# Patient Record
Sex: Female | Born: 1990 | Hispanic: Yes | Marital: Single | Smoking: Former smoker
Health system: Southern US, Academic
[De-identification: ages and names within clinical notes are randomized; demographics above are authoritative.]

## PROBLEM LIST (undated history)

## (undated) DIAGNOSIS — F419 Anxiety disorder, unspecified: Secondary | ICD-10-CM

## (undated) DIAGNOSIS — F32A Depression, unspecified: Secondary | ICD-10-CM

## (undated) DIAGNOSIS — J45909 Unspecified asthma, uncomplicated: Secondary | ICD-10-CM

## (undated) HISTORY — DX: Unspecified asthma, uncomplicated: J45.909

## (undated) HISTORY — DX: Depression, unspecified: F32.A

## (undated) HISTORY — DX: Anxiety disorder, unspecified: F41.9

---
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
## 2011-05-18 ENCOUNTER — Inpatient Hospital Stay: Payer: Self-pay | Admitting: Psychiatry

## 2011-05-18 LAB — URINALYSIS, COMPLETE
Glucose,UR: NEGATIVE mg/dL (ref 0–75)
Nitrite: NEGATIVE
Ph: 8 (ref 4.5–8.0)
Protein: NEGATIVE
RBC,UR: <1 /HPF (ref 0–5)
Squamous Epithelial: 1
WBC UR: <1 /HPF (ref 0–5)

## 2011-05-18 LAB — COMPREHENSIVE METABOLIC PANEL
Albumin: 5.3 g/dL — ABNORMAL HIGH (ref 3.4–5.0)
Alkaline Phosphatase: 68 U/L (ref 50–136)
Calcium, Total: 9.1 mg/dL (ref 8.5–10.1)
Chloride: 104 mmol/L (ref 98–107)
Co2: 23 mmol/L (ref 21–32)
Creatinine: 0.74 mg/dL (ref 0.60–1.30)
Glucose: 108 mg/dL — ABNORMAL HIGH (ref 65–99)
Osmolality: 280 (ref 275–301)
SGOT(AST): 31 U/L (ref 15–37)

## 2011-05-18 LAB — DRUG SCREEN, URINE
Amphetamines, Ur Screen: NEGATIVE (ref ?–1000)
Benzodiazepine, Ur Scrn: NEGATIVE (ref ?–200)
Cocaine Metabolite,Ur ~~LOC~~: NEGATIVE (ref ?–300)
MDMA (Ecstasy)Ur Screen: NEGATIVE (ref ?–500)
Tricyclic, Ur Screen: POSITIVE (ref ?–1000)

## 2011-05-18 LAB — CBC
HCT: 41.1 % (ref 35.0–47.0)
HGB: 13.3 g/dL (ref 12.0–16.0)
MCH: 28.1 pg (ref 26.0–34.0)
MCHC: 32.3 g/dL (ref 32.0–36.0)
Platelet: 242 10*3/uL (ref 150–440)
RBC: 4.73 10*6/uL (ref 3.80–5.20)
WBC: 10 10*3/uL (ref 3.6–11.0)

## 2011-05-18 LAB — ETHANOL
Ethanol %: <0.003 % (ref 0.000–0.080)
Ethanol: <3 mg/dL

## 2011-05-18 LAB — TSH: Thyroid Stimulating Horm: 4.37 u[IU]/mL

## 2011-05-21 LAB — LIPID PANEL
Cholesterol: 130 mg/dL (ref 0–200)
HDL Cholesterol: 60 mg/dL (ref 40–60)
Ldl Cholesterol, Calc: 58 mg/dL (ref 0–100)
VLDL Cholesterol, Calc: 12 mg/dL (ref 5–40)

## 2011-05-31 LAB — CBC
HGB: 10.5 g/dL — ABNORMAL LOW (ref 12.0–16.0)
MCV: 86 fL (ref 80–100)
RDW: 15.5 % — ABNORMAL HIGH (ref 11.5–14.5)

## 2011-05-31 LAB — DRUG SCREEN, URINE
Amphetamines, Ur Screen: NEGATIVE (ref ?–1000)
Barbiturates, Ur Screen: NEGATIVE (ref ?–200)
Benzodiazepine, Ur Scrn: NEGATIVE (ref ?–200)
Cannabinoid 50 Ng, Ur ~~LOC~~: POSITIVE (ref ?–50)
MDMA (Ecstasy)Ur Screen: NEGATIVE (ref ?–500)
Methadone, Ur Screen: NEGATIVE (ref ?–300)
Opiate, Ur Screen: NEGATIVE (ref ?–300)

## 2011-05-31 LAB — URINALYSIS, COMPLETE
Bacteria: NONE SEEN
Glucose,UR: NEGATIVE mg/dL (ref 0–75)
Ketone: NEGATIVE
Nitrite: NEGATIVE
Ph: 7 (ref 4.5–8.0)
Protein: NEGATIVE
RBC,UR: 4 /HPF (ref 0–5)
Specific Gravity: 1.006 (ref 1.003–1.030)
Squamous Epithelial: NONE SEEN
WBC UR: 3 /HPF (ref 0–5)

## 2011-05-31 LAB — COMPREHENSIVE METABOLIC PANEL
Alkaline Phosphatase: 60 U/L (ref 50–136)
Anion Gap: 11 (ref 7–16)
Bilirubin,Total: 0.2 mg/dL (ref 0.2–1.0)
Chloride: 104 mmol/L (ref 98–107)
Co2: 27 mmol/L (ref 21–32)
Creatinine: 0.62 mg/dL (ref 0.60–1.30)
EGFR (African American): >60
EGFR (Non-African Amer.): >60
Potassium: 3.8 mmol/L (ref 3.5–5.1)
Total Protein: 7.2 g/dL (ref 6.4–8.2)

## 2011-05-31 LAB — TSH: Thyroid Stimulating Horm: 1.21 u[IU]/mL

## 2011-05-31 LAB — PREGNANCY, URINE: Pregnancy Test, Urine: NEGATIVE m[IU]/mL

## 2011-05-31 LAB — ETHANOL: Ethanol: <3 mg/dL

## 2011-05-31 LAB — SALICYLATE LEVEL: Salicylates, Serum: 3.6 mg/dL — ABNORMAL HIGH

## 2011-06-01 ENCOUNTER — Inpatient Hospital Stay: Payer: Self-pay | Admitting: Psychiatry

## 2011-06-02 LAB — IRON AND TIBC: Iron Bind.Cap.(Total): 390 ug/dL (ref 250–450)

## 2011-06-02 LAB — FOLATE: Folic Acid: 12.3 ng/mL (ref 3.1–100.0)

## 2011-06-02 LAB — LIPID PANEL
Ldl Cholesterol, Calc: 55 mg/dL (ref 0–100)
Triglycerides: 102 mg/dL (ref 0–200)
VLDL Cholesterol, Calc: 20 mg/dL (ref 5–40)

## 2011-06-02 LAB — HEMOGLOBIN: HGB: 11.1 g/dL — ABNORMAL LOW (ref 12.0–16.0)

## 2011-06-07 LAB — COMPREHENSIVE METABOLIC PANEL
Albumin: 4 g/dL (ref 3.4–5.0)
Alkaline Phosphatase: 57 U/L (ref 50–136)
Anion Gap: 9 (ref 7–16)
BUN: 8 mg/dL (ref 7–18)
Calcium, Total: 8.9 mg/dL (ref 8.5–10.1)
Chloride: 102 mmol/L (ref 98–107)
Co2: 27 mmol/L (ref 21–32)
Creatinine: 0.61 mg/dL (ref 0.60–1.30)
EGFR (Non-African Amer.): >60
Glucose: 78 mg/dL (ref 65–99)
Potassium: 4.3 mmol/L (ref 3.5–5.1)
SGOT(AST): 26 U/L (ref 15–37)
Sodium: 138 mmol/L (ref 136–145)

## 2011-06-07 LAB — VALPROIC ACID LEVEL: Valproic Acid: 91 ug/mL

## 2011-08-12 ENCOUNTER — Emergency Department: Payer: Self-pay | Admitting: Internal Medicine

## 2011-08-12 LAB — URINALYSIS, COMPLETE
Bacteria: NONE SEEN
Bilirubin,UR: NEGATIVE
Blood: NEGATIVE
Glucose,UR: NEGATIVE mg/dL (ref 0–75)
Nitrite: NEGATIVE
Ph: 6 (ref 4.5–8.0)
Protein: NEGATIVE
Specific Gravity: 1.013 (ref 1.003–1.030)

## 2011-08-12 LAB — DRUG SCREEN, URINE
Cannabinoid 50 Ng, Ur ~~LOC~~: NEGATIVE (ref ?–50)
Cocaine Metabolite,Ur ~~LOC~~: NEGATIVE (ref ?–300)
MDMA (Ecstasy)Ur Screen: NEGATIVE (ref ?–500)
Methadone, Ur Screen: NEGATIVE (ref ?–300)
Opiate, Ur Screen: NEGATIVE (ref ?–300)
Phencyclidine (PCP) Ur S: NEGATIVE (ref ?–25)
Tricyclic, Ur Screen: NEGATIVE (ref ?–1000)

## 2011-08-12 LAB — COMPREHENSIVE METABOLIC PANEL
Albumin: 4.4 g/dL (ref 3.4–5.0)
BUN: 11 mg/dL (ref 7–18)
Bilirubin,Total: 0.4 mg/dL (ref 0.2–1.0)
Calcium, Total: 8.8 mg/dL (ref 8.5–10.1)
Chloride: 102 mmol/L (ref 98–107)
Co2: 25 mmol/L (ref 21–32)
EGFR (African American): >60
Glucose: 100 mg/dL — ABNORMAL HIGH (ref 65–99)
Osmolality: 277 (ref 275–301)
SGOT(AST): 28 U/L (ref 15–37)
SGPT (ALT): 21 U/L
Sodium: 139 mmol/L (ref 136–145)

## 2011-08-12 LAB — CBC
HGB: 11.5 g/dL — ABNORMAL LOW (ref 12.0–16.0)
MCH: 27.8 pg (ref 26.0–34.0)
MCHC: 32.3 g/dL (ref 32.0–36.0)
MCV: 86 fL (ref 80–100)
RBC: 4.13 10*6/uL (ref 3.80–5.20)
RDW: 16.9 % — ABNORMAL HIGH (ref 11.5–14.5)
WBC: 9.6 10*3/uL (ref 3.6–11.0)

## 2011-08-12 LAB — ACETAMINOPHEN LEVEL: Acetaminophen: <2 ug/mL

## 2011-08-12 LAB — ETHANOL: Ethanol %: 0.078 % (ref 0.000–0.080)

## 2011-08-12 LAB — TSH: Thyroid Stimulating Horm: 3.11 u[IU]/mL

## 2011-08-12 LAB — PREGNANCY, URINE: Pregnancy Test, Urine: NEGATIVE m[IU]/mL

## 2011-08-12 LAB — SALICYLATE LEVEL: Salicylates, Serum: <1.7 mg/dL

## 2012-12-20 ENCOUNTER — Inpatient Hospital Stay: Payer: Self-pay | Admitting: Internal Medicine

## 2012-12-20 LAB — VALPROIC ACID LEVEL
Valproic Acid: 107 ug/mL — ABNORMAL HIGH
Valproic Acid: 181 ug/mL — ABNORMAL HIGH
Valproic Acid: 146 ug/mL — ABNORMAL HIGH

## 2012-12-20 LAB — COMPREHENSIVE METABOLIC PANEL
Albumin: 3.9 g/dL (ref 3.4–5.0)
Alkaline Phosphatase: 84 U/L (ref 50–136)
Anion Gap: 9 (ref 7–16)
BUN: 13 mg/dL (ref 7–18)
Bilirubin,Total: 0.1 mg/dL — ABNORMAL LOW (ref 0.2–1.0)
Chloride: 109 mmol/L — ABNORMAL HIGH (ref 98–107)
Co2: 23 mmol/L (ref 21–32)
Creatinine: 0.57 mg/dL — ABNORMAL LOW (ref 0.60–1.30)
Osmolality: 281 (ref 275–301)
SGOT(AST): 25 U/L (ref 15–37)
SGPT (ALT): 18 U/L (ref 12–78)
Sodium: 141 mmol/L (ref 136–145)
Total Protein: 7.8 g/dL (ref 6.4–8.2)

## 2012-12-20 LAB — URINALYSIS, COMPLETE
Bilirubin,UR: NEGATIVE
Blood: NEGATIVE
Nitrite: NEGATIVE
Specific Gravity: 1.016 (ref 1.003–1.030)
Squamous Epithelial: 1

## 2012-12-20 LAB — DRUG SCREEN, URINE
Amphetamines, Ur Screen: NEGATIVE (ref ?–1000)
Barbiturates, Ur Screen: NEGATIVE (ref ?–200)
Cocaine Metabolite,Ur ~~LOC~~: NEGATIVE (ref ?–300)
Methadone, Ur Screen: NEGATIVE (ref ?–300)
Phencyclidine (PCP) Ur S: NEGATIVE (ref ?–25)
Tricyclic, Ur Screen: NEGATIVE (ref ?–1000)

## 2012-12-20 LAB — AMMONIA
Ammonia, Plasma: 122 mcmol/L — ABNORMAL HIGH (ref 11–32)
Ammonia, Plasma: 42 mcmol/L — ABNORMAL HIGH (ref 11–32)

## 2012-12-20 LAB — TSH: Thyroid Stimulating Horm: 3.27 u[IU]/mL

## 2012-12-20 LAB — CBC
HCT: 37.3 % (ref 35.0–47.0)
HGB: 12.5 g/dL (ref 12.0–16.0)
MCH: 28.8 pg (ref 26.0–34.0)
MCHC: 33.4 g/dL (ref 32.0–36.0)
MCV: 86 fL (ref 80–100)

## 2012-12-20 LAB — ETHANOL: Ethanol: 149 mg/dL

## 2012-12-20 LAB — PREGNANCY, URINE: Pregnancy Test, Urine: NEGATIVE m[IU]/mL

## 2012-12-20 LAB — CARBAMAZEPINE LEVEL, TOTAL: Carbamazepine: 0.8 ug/mL — ABNORMAL LOW (ref 4.0–12.0)

## 2012-12-21 LAB — CBC WITH DIFFERENTIAL/PLATELET
Basophil %: 0.4 %
Eosinophil #: 0 10*3/uL (ref 0.0–0.7)
Eosinophil %: 0.2 %
HCT: 32.7 % — ABNORMAL LOW (ref 35.0–47.0)
Lymphocyte %: 20.2 %
MCHC: 33.1 g/dL (ref 32.0–36.0)
MCV: 86 fL (ref 80–100)
Monocyte #: 0.3 x10 3/mm (ref 0.2–0.9)
Monocyte %: 3.4 %
Neutrophil %: 75.8 %
Platelet: 185 10*3/uL (ref 150–440)
RBC: 3.8 10*6/uL (ref 3.80–5.20)
WBC: 10.2 10*3/uL (ref 3.6–11.0)

## 2012-12-21 LAB — BASIC METABOLIC PANEL
Anion Gap: 6 — ABNORMAL LOW (ref 7–16)
BUN: 8 mg/dL (ref 7–18)
Calcium, Total: 7.7 mg/dL — ABNORMAL LOW (ref 8.5–10.1)
Chloride: 110 mmol/L — ABNORMAL HIGH (ref 98–107)
Co2: 24 mmol/L (ref 21–32)
Creatinine: 0.72 mg/dL (ref 0.60–1.30)
EGFR (African American): >60
EGFR (Non-African Amer.): >60
Glucose: 53 mg/dL — ABNORMAL LOW (ref 65–99)
Osmolality: 275 (ref 275–301)
Potassium: 3.7 mmol/L (ref 3.5–5.1)
Sodium: 140 mmol/L (ref 136–145)

## 2012-12-21 LAB — HEPATIC FUNCTION PANEL A (ARMC)
Albumin: 3.5 g/dL (ref 3.4–5.0)
Alkaline Phosphatase: 67 U/L (ref 50–136)
SGOT(AST): 22 U/L (ref 15–37)

## 2012-12-21 LAB — VALPROIC ACID LEVEL
Valproic Acid: 107 ug/mL — ABNORMAL HIGH
Valproic Acid: 109 ug/mL — ABNORMAL HIGH

## 2012-12-21 LAB — AMMONIA: Ammonia, Plasma: 46 mcmol/L — ABNORMAL HIGH (ref 11–32)

## 2012-12-23 ENCOUNTER — Inpatient Hospital Stay: Payer: Self-pay | Admitting: Psychiatry

## 2013-01-04 LAB — BEHAVIORAL MEDICINE 1 PANEL
Albumin: 3.8 g/dL (ref 3.4–5.0)
Alkaline Phosphatase: 68 U/L
Anion Gap: 2 — ABNORMAL LOW (ref 7–16)
BUN: 11 mg/dL (ref 7–18)
Basophil #: 0 10*3/uL (ref 0.0–0.1)
Basophil %: 0.7 %
Bilirubin,Total: 0.2 mg/dL (ref 0.2–1.0)
Chloride: 106 mmol/L (ref 98–107)
EGFR (Non-African Amer.): >60
Eosinophil #: 0.1 10*3/uL (ref 0.0–0.7)
Eosinophil %: 2.1 %
Glucose: 81 mg/dL (ref 65–99)
Lymphocyte %: 37.7 %
MCH: 29.1 pg (ref 26.0–34.0)
Monocyte #: 0.4 x10 3/mm (ref 0.2–0.9)
Osmolality: 274 (ref 275–301)
Platelet: 213 10*3/uL (ref 150–440)
Potassium: 4 mmol/L (ref 3.5–5.1)
RBC: 4.29 10*6/uL (ref 3.80–5.20)
RDW: 15.1 % — ABNORMAL HIGH (ref 11.5–14.5)
SGPT (ALT): 28 U/L (ref 12–78)

## 2013-01-14 LAB — URINALYSIS, COMPLETE
Bilirubin,UR: NEGATIVE
Blood: NEGATIVE
Glucose,UR: NEGATIVE mg/dL (ref 0–75)
Ketone: NEGATIVE
Nitrite: NEGATIVE
Ph: 9 (ref 4.5–8.0)
Protein: NEGATIVE
Specific Gravity: 1.011 (ref 1.003–1.030)
Squamous Epithelial: 4

## 2014-06-05 NOTE — Consult Note (Signed)
Psychological Assessment  Jenna LekLizbeth Gonzalez21of Evaluation: 11-13-14Administered: [place] Multiphasic Personality Inventory-2 (MMPI-2) for Referral: Ms. Jenna Colon was referred for a psychological assessment by her physician, Kristine LineaJolanta Pucilowska, MD. She was admitted to Behavioral Medicine for the treatment of increasing depression and suicidal behavior. Please see the history and physical and psychosocial history for further background information. An assessment of personality structure was requested. The MMPI-2 validity scales indicate that the clinical profile is not valid. It is not clear whether her inconsistent responses are related to resistance or lack of cooperation, psychotic processes, distortion or exaggeration of symptoms for secondary gain or as an attempt to draw attention to the distress she experiences. The clinical profile is most likely an accurate representation of her experience. Impression:profile   Electronic Signatures: Carola FrostRoush, Lee Ann (PsyD, HSP-P)  (Signed on 14-Nov-14 14:55)  Authored  Last Updated: 14-Nov-14 14:55 by Carola Frostoush, Lee Ann (PsyD, HSP-P)

## 2014-06-05 NOTE — Consult Note (Signed)
Brief Consult Note: Diagnosis: Major depression.   Patient was seen by consultant.   Recommend further assessment or treatment.   Comments: Transfer to psychiatry.  Electronic Signatures: Kristine LineaPucilowska, Chellsie Gomer (MD)  (Signed [phone]-Nov-14 17:16)  Authored: Brief Consult Note   Last Updated: 10-Nov-14 17:16 by Kristine LineaPucilowska, Nykia Turko (MD)
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
## 2014-06-05 NOTE — Discharge Summary (Signed)
PATIENT NAME:  Jenna Colon, Jenna Colon MR#:  [id number] DATE OF BIRTH:  Nov 28, 1990  DATE OF ADMISSION:  12/20/2012 DATE OF DISCHARGE:  12/23/2012  ADMITTING DIAGNOSES: Decrease in responsiveness.   DISCHARGE DIAGNOSES: 1.  Acute encephalopathy as a result of ingestion of carbamazepine Depakote, trazodone, Risperdal. 2.  Depression with suicidal attempt. 3.  Hypoglycemia, likely reactive.  4.  Alcohol abuse, no evidence of withdrawal.   CONSULTANTS: Psychiatry, as well as Poison Control over the phone.  LABORATORY AND RADIOLOGICAL DATA: Tegretol level was 0.8. Valproic acid level was 107. TSH 3.27. Salicylate level was less than 1.7. WBC 7.5, hemoglobin 12.5, platelet count 202. Alcohol level was 0.149. BMP: Glucose was 95, BUN 13, creatinine 0.57, sodium 141, potassium 3.7, chloride 109; CO2 was 23, calcium 7.9. LFTs were normal. Pregnancy test was negative. Acetaminophen level less than 2. TUDS were negative. CT scan of the head showed no acute abnormality. Ammonia level was elevated at 66. Valproic acid level went as high as 181. Valproic acid level on  admission elevated 9th was 95.   HOSPITAL COURSE: Please refer to H and P done by the admitting physician. The patient is a 24 year old Spanish female who was brought into the hospital by her roommate after she was found to be poorly responsive. The patient had multiple bottles next to her noted, including Depakote, carbamazepine, trazodone and Risperdal. It is unclear how many pills she took but due to this, we were asked to admit the patient for overdose. The patient was given supportive care. Poison Control was called. The patient was noted to have elevated Depakote level and had  ammonia level elevated; therefore, she was recommended to be started on carnitine IV until her mental status improved. Then, the carnitine was discontinued. The patient'Colon mental status is much improved and is back to baseline. She was seen by psychiatry, who recommended that  the patient be admitted for inpatient so therefore, she is currently being transported to Behavioral Medicine.   DISCHARGE MEDICATIONS: Multivitamin daily.   DIET: Regular.   ACTIVITY: As tolerated.   FOLLOWUP: With outpatient psychiatry once discharged from the psych facility.  DISCHARGE TIME SPENT: 32 minutes.    ____________________________ Lacie ScottsShreyang H. Allena KatzPatel, MD shp:jcm D: 12/23/2012 20:14:51 ET T: 12/23/2012 21:19:08 ET JOB#: [id number]  cc: Burch Marchuk H. Allena KatzPatel, MD, <Dictator> Charise CarwinSHREYANG H Rishi Vicario MD ELECTRONICALLY SIGNED 12/27/2012 16:51

## 2014-06-05 NOTE — Consult Note (Signed)
Patient seen for followup ECT consult. Her mood is a little bit better but still very depressed. Still has daily suicidal ideation. She is still interested in ECT. We discussed the practicalities and difficulties of it. She is still very interested in pursuing it and given her depression and failure to fully respond to other treatment it appears appropriate. Dr. Jennet MaduroPucilowska has already discontinued the carbamazepine. We can probably proceed with treatment on Monday     Electronic Signatures: Bertrand Vowels, Jackquline DenmarkJohn T (MD)  (Signed on 20-Nov-14 17:51)  Authored  Last Updated: 20-Nov-14 17:51 by Audery Amellapacs, Vikkie Goeden T (MD)
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
## 2014-06-05 NOTE — Consult Note (Signed)
PATIENT NAME:  Jenna Colon, Jenna Colon MR#:  [id number] DATE OF BIRTH:  02-26-1990  AGE:  24 years  SEX:  Female  RACE:  Hispanic  DATE OF ADMISSION:  12/20/2012  DATE OF CONSULTATION:  12/21/2012  PLACE OF DICTATION:  [facility], [place], room #107  CONSULTING PHYSICIAN:  Duane Earnshaw K. Jru Pense, MD  SUBJECTIVE:  The patient was seen in consultation in room #107. The patient is a 24 year old Hispanic female, not employed, and lives with a roommate. 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. The [facility] after having taken overdose on risperidone, difficult because she was very depressed. According to information obtained from the chart, the patient arrived and she had to be given Narcan. She was very hypotensive. She arrived to EMS with open bottles. She had pinpoint pupils. She was given normal saline, and her blood pressure improved. She has no signs of sepsis, and  positive for alcohol. Her serum Depakote levels were elevated, so serial Depakote levels were done. Evidently there is proof that she overdosed on Depakote because she was depressed and suicidal.  PAST PSYCHIATRIC HISTORY:  The patient reported that this is her 4th admission, and she had 3 previous admissions to Psychiatry. History of suicide attempt before. Admits that she is being followed by Psychiatry on an outpatient basis, but cannot remember his name, and was frustrated, irritable. When questioned about alcohol and drugs, does admit drinking alcohol, but could not give exact number. Denies any other street or prescription drug abuse.  MENTAL STATUS:  The patient is seen lying in bed, very drowsy, but is easily arousable. She knew that [facility], and she knew the date and time. Affect is guarded, mood is  depressed. Did have suicidal wishes and thoughts when she took overdose of pills, but currently she contracts for safety, and she wants to get help. No psychosis. Denies auditory or visual hallucinations.  Denies hearing voices or seeing things. Denies any paranoid or suspicious ideas. She is still sleepy and drowsy, so further mental status was not done at this time.  IMPRESSION: 1.  Major depression, recurrent, with suicidal ideas and suicide attempt by overdose on Depakote and Risperdal.   2.  Alcohol abuse/dependence.  RECOMMENDATIONS: Continue current treatment. Continue one-on-one observation tonight, as patient'Colon gait is staggering and she is drowsy. Will come for her re-evaluation later to see if she is stable enough and medically cleared so she can be followed in Behavioral Health unit. [facility] after she is medically cleared and stable in a couple of days.     ____________________________ Jannet MantisSurya K. Guss Bundehalla, MD skc:mr D: 12/21/2012 19:22:00 ET T: 12/21/2012 21:45:58 ET JOB#: [id number]  cc: Monika SalkSurya K. Guss Bundehalla, MD, <Dictator> Beau FannySURYA K Gitty Osterlund MD ELECTRONICALLY SIGNED 12/22/2012 13:37

## 2014-06-05 NOTE — H&P (Signed)
PATIENT NAME:  Jenna Colon, [name] MR#:  [id number] DATE OF BIRTH:  [birth year]  DATE OF ADMISSION:  12/20/2012  PRIMARY CARE PHYSICIAN:  Unknown  PSYCHIATRIST: Dr. Mare FerrariLavine  CHIEF COMPLAINT: Brought in by house mate with unresponsiveness.   HISTORY OF PRESENT ILLNESS: This is a 24 year old female who is unable to give any medical history at this time. Her house mate is a person that works with her at [employer] and she has been staying with her house mate for the last few weeks. The patient was last seen up and about and normal at 10:30 p.m. This morning, she was unable to arouse her and could not get her up. There was a Vodka bottle there and empty pill bottles of carbamazepine which had 30 pills in  it, Depakote ER 250 mg which had 45 pills in it, trazodone 100 mg which had 15 pills in it and Risperdal 4 mg that had 15 pills in it. The patient was unresponsive in the ER, Depakote level was slightly above the normal range. Hospitalist services were contacted for further evaluation.   PAST MEDICAL HISTORY: Psychiatric issues, unknown if any further medical problems.   PAST SURGICAL HISTORY: Unknown.   ALLERGIES: As per the chart, no known drug allergies.   MEDICATIONS:  Bottles including carbamazepine 200 mg daily, Depakote ER 250 mg daily, trazodone 100 mg at bedtime, Risperdal 4 mg daily.   FAMILY HISTORY: Unable to obtain.   SOCIAL HISTORY: As per friend works at [employer]. No smoking. Unclear alcohol history.   REVIEW OF SYSTEMS:  Unable to obtain secondary to altered mental status.   PHYSICAL EXAMINATION: VITAL SIGNS: Temperature 98.9, pulse 97, respirations 10, blood pressure 82/46, pulse ox 97% on room air.  GENERAL: No respiratory distress. Seems to be able to maintain airway.  EYES: Conjunctivae normal. Lids normal. Pupils pinpoint. Unable to test extraocular muscles. EARS, NOSE, MOUTH, AND THROAT: Unable to open mouth.  Tympanic membranes: No erythema.  CARDIOVASCULAR: S1,  S2, tachycardic. No gallops, rubs or murmurs heard. Carotid upstroke 2+ bilaterally. No bruits.  EXTREMITIES: Dorsalis pedis pulses 2+ bilaterally.  No edema of the edema of the lower extremity.  RESPIRATORY:  Lungs clear to auscultation. No use of accessory muscles to breathe.  NECK: No JVD. No bruits. No lymphadenopathy. No thyromegaly. No thyroid nodules palpated.  GASTROINTESTINAL: Abdomen is soft, nontender. No organomegaly/splenomegaly. Normoactive bowel sounds. No masses felt.  LYMPHATIC: No lymph nodes in the neck.  MUSCULOSKELETAL: No clubbing, edema or cyanosis.  SKIN: No ulcers or lesions.  NEUROLOGIC: Unable to test secondary to altered mental status. The patient turns head when I flashed the light in her eyes. Grimaces with sternal rub. Babinski negative. Deep tendon reflexes 1+ bilateral lower extremities.  PSYCHIATRIC: Unable to assess at this time.   LABORATORY AND RADIOLOGICAL DATA: CT scan of the head negative. Chest x-ray negative. Urine toxicology negative. Urinalysis negative. Pregnancy test negative. Acetaminophen test negative. Glucose 95, BUN 13, creatinine 0.57, sodium 141, potassium 3.7, chloride 109, CO2 23, calcium 7.9. Liver function tests normal. Alcohol level 149. White blood cell count 7.5, hemoglobin and hematocrit 12.5 and 37.3, platelet count of 202. Salicylate less than 1.7. TSH 3.27, valproic acid 107, Tegretol low at 0.8.   EKG: Sinus tachycardia, QTc less than 500.   ASSESSMENT AND PLAN: 1.  Acute encephalopathy hypotension, likely a suicide attempt. The patient has 4 empty pill bottles of carbamazepine, Depakote, trazodone and Risperdal. I will check an ammonia level stat which could  be contributing to the patient's altered mental status. The patient will be put on CIWA protocol for EtOH. I will admit to the Critical Care Unit. I have seen people with Depakote overdoses that need intubation and have respiratory failure. Close clinical monitoring at this time.  Currently no need for intubation. Continue to monitor mental status and neurologic checks. Once the patient wakes up, we will get a psychiatric evaluation. We will put on suicide precautions. For the patient's hypotension, we will give IV fluid hydration, likely she normally has low blood pressure but with all these meds it can be even worse. We will give fluid bolus and continue to monitor. I did speak with poison control and they recommended a repeat ammonia level later on this evening and serial Depakote levels. Hold off on the L-carnitine  or lactulose at this point in time.  2. Alcohol. Unclear if this is just an acute intoxication, or chronic alcoholism. We will put her on CIWA protocol.   Unfortunately, I was unable to contact the mother.  The 2 phone numbers in the chart were negative. Only history and physical obtained from work mate that the patient now is staying with.  TIME SPENT ON ADMISSION: 60 minutes critical care time.    ____________________________ Herschell Dimes. Renae Gloss, MD rjw:dp D: 12/20/2012 13:21:46 ET T: 12/20/2012 13:55:49 ET JOB#: [id number]  cc: Herschell Dimes. Renae Gloss, MD, <Dictator> Maryjean Morn. Mare Ferrari, MD  Salley Scarlet MD ELECTRONICALLY SIGNED 12/20/2012 19:01
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
## 2015-06-05 IMAGING — CR DG CHEST 1V PORT
1 series · 1 of 1 positions shown · non-contrast
Comparison: 08/12/2011

CLINICAL DATA: Altered mental status

EXAM:
PORTABLE CHEST - 1 VIEW

[ap]
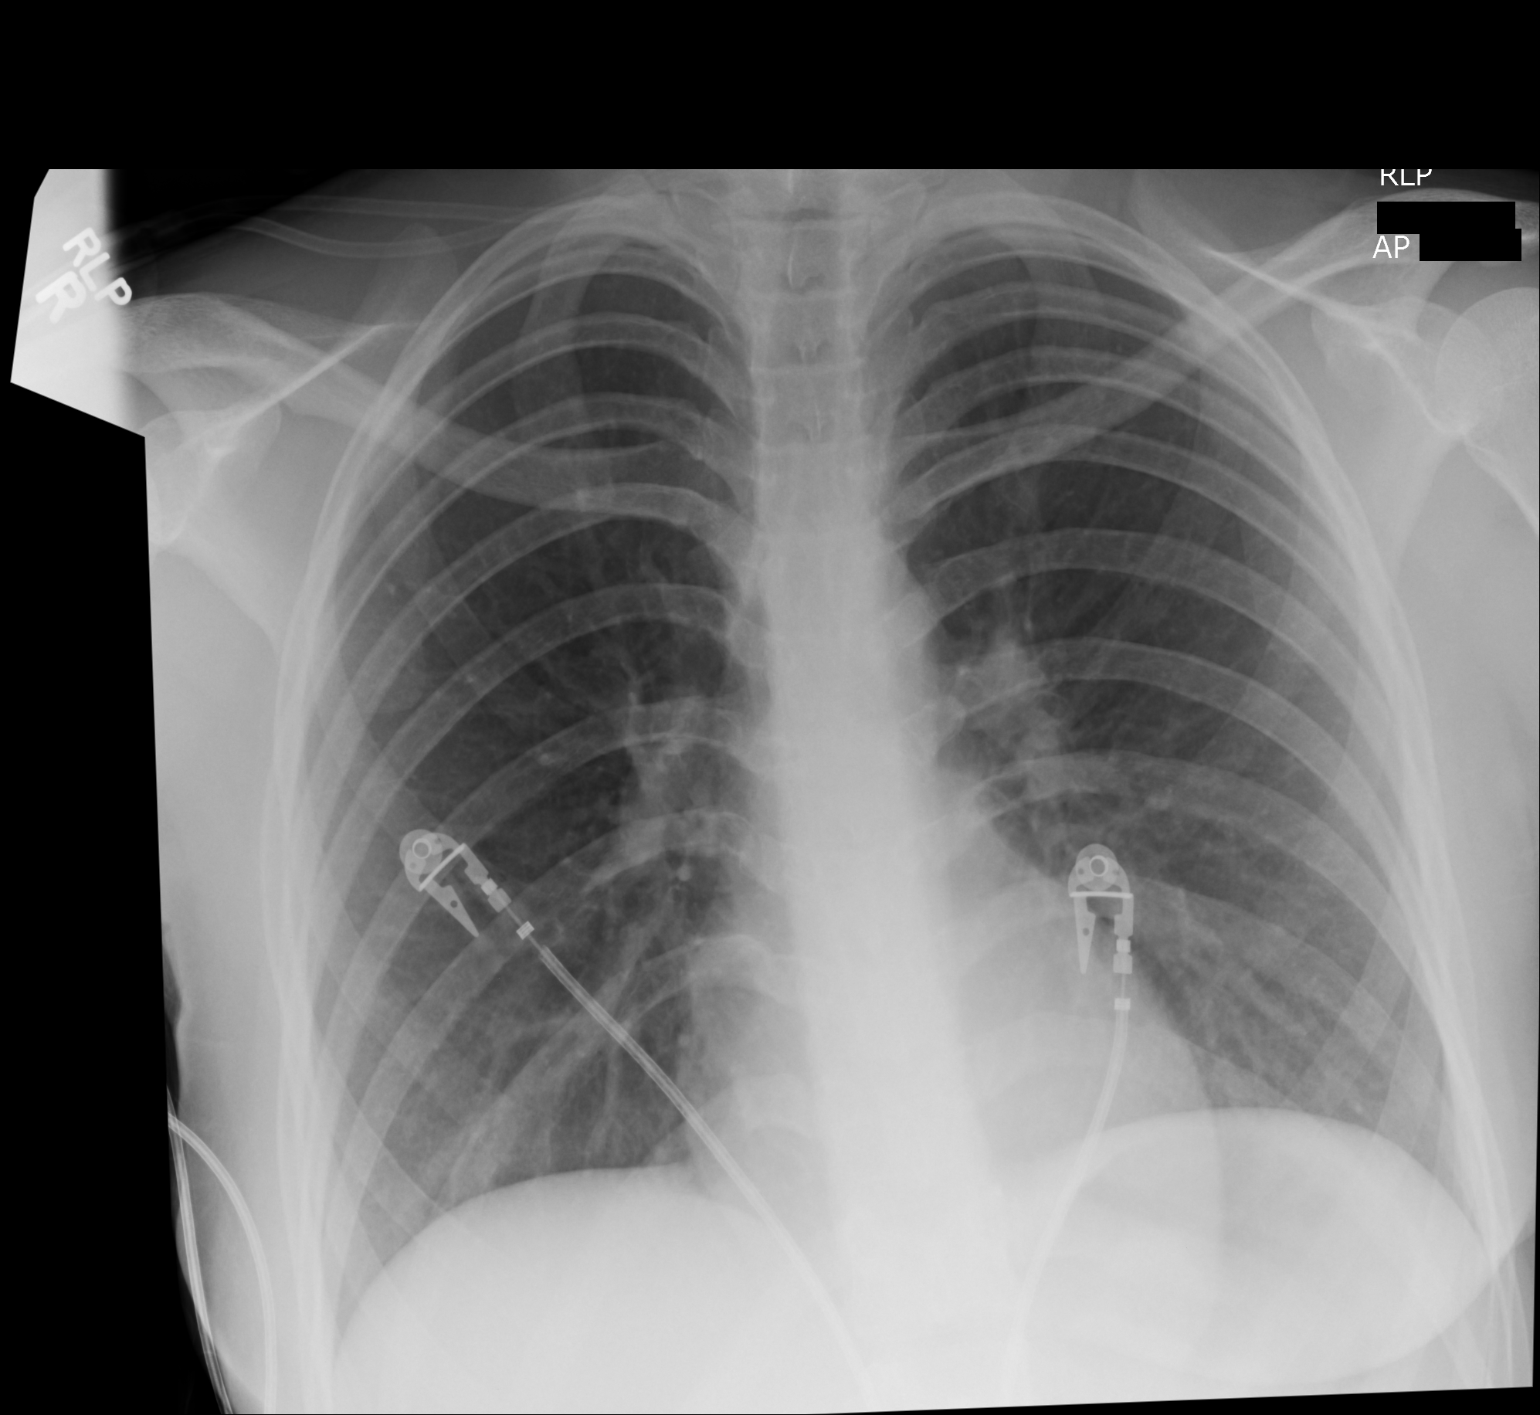

[1 of 1 positions shown; findings below may reference images not displayed]

FINDINGS: 2199 hr. Tiny nodular density in the right upper lobe is unchanged,
likely representing calcified granuloma. Lungs are otherwise clear
without edema or focal airspace consolidation. The cardiopericardial
silhouette is within normal limits for size. Imaged bony structures
of the thorax are intact. Telemetry leads overlie the chest.
IMPRESSION: Stable. No new or acute cardiopulmonary findings.

## 2020-03-15 ENCOUNTER — Emergency Department (HOSPITAL_COMMUNITY): Payer: Self-pay

## 2020-03-15 ENCOUNTER — Other Ambulatory Visit: Payer: Self-pay

## 2020-03-15 ENCOUNTER — Encounter (HOSPITAL_COMMUNITY): Payer: Self-pay

## 2020-03-15 ENCOUNTER — Emergency Department
Admission: EM | Admit: 2020-03-15 | Discharge: 2020-03-16 | Disposition: A | Discharge status: Other Acute Inpatient Hospital | Payer: Self-pay | Attending: Emergency Medicine | Admitting: Emergency Medicine

## 2020-03-15 DIAGNOSIS — R45851 Suicidal ideations: Secondary | ICD-10-CM | POA: Insufficient documentation

## 2020-03-15 DIAGNOSIS — F319 Bipolar disorder, unspecified: Secondary | ICD-10-CM | POA: Insufficient documentation

## 2020-03-15 DIAGNOSIS — U071 COVID-19: Secondary | ICD-10-CM | POA: Insufficient documentation

## 2020-03-15 LAB — CBC WITH DIFF
BASOPHIL #: <0.1 10*3/uL (ref <=0.20)
BASOPHIL %: 1 %
EOSINOPHIL #: <0.1 10*3/uL (ref <=0.50)
EOSINOPHIL %: 1 %
HCT: 43.1 % (ref 34.8–46.0)
HGB: 14.3 g/dL (ref 11.5–16.0)
IMMATURE GRANULOCYTE #: <0.1 10*3/uL (ref <0.10)
IMMATURE GRANULOCYTE %: 0 % (ref 0–1)
LYMPHOCYTE #: 1.78 10*3/uL (ref 1.00–4.80)
LYMPHOCYTE %: 25 %
MCH: 31.2 pg (ref 26.0–32.0)
MCHC: 33.2 g/dL (ref 31.0–35.5)
MCV: 93.9 fL (ref 78.0–100.0)
MONOCYTE #: 0.41 10*3/uL (ref 0.20–1.10)
MONOCYTE %: 6 %
MPV: 11.2 fL (ref 8.7–12.5)
NEUTROPHIL #: 4.77 10*3/uL (ref 1.50–7.70)
NEUTROPHIL %: 67 %
PLATELETS: 288 10*3/uL (ref 150–400)
RBC: 4.59 10*6/uL (ref 3.85–5.22)
RDW-CV: 11.9 % (ref 11.5–15.5)
WBC: 7.1 10*3/uL (ref 3.7–11.0)

## 2020-03-15 LAB — COMPREHENSIVE METABOLIC PANEL, NON-FASTING
ALBUMIN: 4.6 g/dL (ref 3.5–5.0)
ALKALINE PHOSPHATASE: 58 U/L (ref 40–110)
ALT (SGPT): 33 U/L — ABNORMAL HIGH (ref 8–22)
ANION GAP: 10 mmol/L (ref 4–13)
AST (SGOT): 39 U/L (ref 8–45)
BILIRUBIN TOTAL: 0.6 mg/dL (ref 0.3–1.3)
BUN/CREA RATIO: 18 (ref 6–22)
BUN: 14 mg/dL (ref 8–25)
CALCIUM: 9.8 mg/dL (ref 8.5–10.0)
CHLORIDE: 104 mmol/L (ref 96–111)
CO2 TOTAL: 26 mmol/L (ref 22–30)
CREATININE: 0.77 mg/dL (ref 0.60–1.05)
ESTIMATED GFR: >90 mL/min/BSA (ref >=60)
GLUCOSE: 94 mg/dL (ref 65–125)
POTASSIUM: 4.7 mmol/L (ref 3.5–5.1)
PROTEIN TOTAL: 7.9 g/dL (ref 6.4–8.3)
SODIUM: 140 mmol/L (ref 136–145)

## 2020-03-15 LAB — URINALYSIS WITH MICROSCOPIC REFLEX IF INDICATED BMC/JMC ONLY
BILIRUBIN: NEGATIVE mg/dL
BLOOD: NEGATIVE mg/dL
GLUCOSE: NEGATIVE mg/dL
LEUKOCYTES: NEGATIVE WBCs/uL
NITRITE: NEGATIVE
PH: 6 (ref <8.0)
SPECIFIC GRAVITY: >=1.03 — ABNORMAL HIGH (ref <1.022)
UROBILINOGEN: 0.2 mg/dL (ref <=2.0)

## 2020-03-15 LAB — DRUG SCREEN, NO CONFIRMATION, URINE
AMPHETAMINES, URINE: NEGATIVE
BARBITURATES URINE: NEGATIVE
BENZODIAZEPINES URINE: NEGATIVE
BUPRENORPHINE URINE: NEGATIVE
CANNABINOIDS URINE: NEGATIVE
COCAINE METABOLITES URINE: NEGATIVE
CREATININE RANDOM URINE: 350 mg/dL — ABNORMAL HIGH (ref 50–100)
ECSTASY/MDMA URINE: NEGATIVE
FENTANYL, RANDOM URINE: NEGATIVE
METHADONE URINE: NEGATIVE
OPIATES URINE (LOW CUTOFF): NEGATIVE
OXYCODONE URINE: NEGATIVE

## 2020-03-15 LAB — ACETAMINOPHEN LEVEL: ACETAMINOPHEN LEVEL: <5 ug/mL — ABNORMAL LOW (ref 10–30)

## 2020-03-15 LAB — URINALYSIS, MICROSCOPIC

## 2020-03-15 LAB — COVID-19, FLU A/B, RSV RAPID BY PCR
INFLUENZA VIRUS TYPE A: NOT DETECTED
INFLUENZA VIRUS TYPE B: NOT DETECTED
RESPIRATORY SYNCTIAL VIRUS (RSV): NOT DETECTED
SARS-CoV-2: DETECTED — AB

## 2020-03-15 LAB — BLUE TOP TUBE

## 2020-03-15 LAB — HCG, URINE QUALITATIVE, PREGNANCY: HCG URINE QUALITATIVE: NEGATIVE

## 2020-03-15 LAB — THYROID STIMULATING HORMONE (SENSITIVE TSH): TSH: 3.463 u[IU]/mL (ref 0.430–3.550)

## 2020-03-15 LAB — ETHANOL, SERUM: ETHANOL: NOT DETECTED

## 2020-03-15 LAB — LIGHT GREEN TOP TUBE

## 2020-03-15 LAB — SALICYLATE ACID LEVEL: SALICYLATE LEVEL: <5 mg/dL — ABNORMAL LOW (ref 15–30)

## 2020-03-15 NOTE — Behavioral Health (Signed)
[facility]: Debra Castaneda) said they have one pending bed. Call during the day and see if they came. Supposed to be here at 10:30, so call around 10:45-11:45am. Debra said that Crisis can fax patient's referral and, if the bed is available tomorrow, the unit would call the patient or the Crisis line. Crisis to fax referral to [phone]     [facility]: Spoke to [place], no beds and no expected discharges tomorrow    [facility]: Called and got a dial tone, unable to leave message    [facility]: Dorna Mai said they would not be able to admit pt because she would be unable to do phone assessment or do ADLs, which would make pt inappropriate for unit.    [facility]: Left a voicemail requesting a call back about charity bed availability     [facility]): Was transferred to the unit's voicemail, left a voicemail requesting a call back about charity bed availability

## 2020-03-15 NOTE — ED Triage Notes (Signed)
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 Pt answers very few questions. She does say that she has suicidal thoughts with no plan. She states that she does hear things at times and feel lost in her head. Girlfriend reports that she has not been eating or sleeping since Saturday.
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
## 2020-03-15 NOTE — ED Provider Notes (Signed)
Evlyn Kanner, PA-C  [facility]  Emergency Department Visit Note    Date: 03/15/2020  Primary care provider: No Pcp  Means of arrival: private car  History obtained by: patient and caregiver / friend  History limited by: none      Chief Complaint: depression, suicidal ideations    History of Present Illness     Debra Castaneda, date of birth 1990-04-30, is a 30 y.o. female who presents to the Emergency Department complaining of feeling "mixed up in my head". The patient is very reluctant to provide information. Her friend relates that the patient has a history of Bipolar I disorder and had been on lithium in the past however has not been on medical treatment in over a year. She states that in the last week approximately the patient has been having mood swings, inability to sleep, depression and thoughts of ending her life. She denies that she has a plan to do so. She notes that the pateint had been hospitalized in the past in [place].       Review of Systems     The pertinent positive and negative symptoms are as per HPI. All other systems reviewed and are negative.    Patient History      Past Medical History:  History reviewed. No pertinent past medical history.    Past Surgical History:  History reviewed. No pertinent surgical history.    Family History:  Family Medical History:    None       Social History:  Social History     Tobacco Use    Smoking status: Never Smoker    Smokeless tobacco: Never Used     Social History     Substance and Sexual Activity   Drug Use Not on file       Medications:  No current outpatient medications on file.       Allergies:   Allergies   Allergen Reactions    Penicillins        Physical Exam     Vital Signs:  ED Triage Vitals [03/15/20 1345]   BP (Non-Invasive) (!) 148/79   Heart Rate 72   Respiratory Rate 18   Temperature 37.1 C (98.7 F)   SpO2 95 %   Weight 60.5 kg (133 lb 6.1 oz)   Height 1.6 m (5\' 3" )       The initial visit vital signs are reviewed as  above.     Pulse Ox: 95% on None (Room Air); interpreted by me as:  Normal  GENERAL:  This is a well appearing  30 y.o.  female  who is interactive, appropriate and showing no outward signs of distress.    HEENT:  Atraumatic, normocephalic.  Anicteric.  PERRL.  Conjunctiva normal in appearance.  Oropharynx is clear.  Mucous membranes are moist.     NECK:  Supple, no nuchal rigidity or meningeal signs. Trachea is midline. No anterior cervical adenopathy.  CHEST:  No signs of trauma.  Normal and equal expansion of the thorax while breathing.  HEART:  Regular rate and rhythm without an obvious murmur heard.  LUNGS:  Clear to ascultation bilaterally without any adventitious sounds.  ABDOMEN:  Non-distended.  Bowel sounds present. Soft, non-tender to palpation.  BACK:  Non-tender to palpation.  No CVAT.  SKIN:  Warm, good color.  No obvious significant rashes noted. No diaphoresis.  EXTREMITY:  Good range of motion.  Normal strength throughout.  No calf tenderness or peripheral  edema noted.  NEURO/PSYCH:  Patient is interactive, appropriate and no gross abnormal neurological findings.     Diagnostics     Labs:    Results for orders placed or performed during the hospital encounter of 03/15/20   HCG, URINE QUALITATIVE, PREGNANCY   Result Value Ref Range    HCG URINE QUALITATIVE Negative Negative   COMPREHENSIVE METABOLIC PANEL, NON-FASTING   Result Value Ref Range    SODIUM 140 136 - 145 mmol/L    POTASSIUM 4.7 3.5 - 5.1 mmol/L    CHLORIDE 104 96 - 111 mmol/L    CO2 TOTAL 26 22 - 30 mmol/L    ANION GAP 10 4 - 13 mmol/L    BUN 14 8 - 25 mg/dL    CREATININE 8.33 8.25 - 1.05 mg/dL    BUN/CREA RATIO 18 6 - 22    ESTIMATED GFR >90 >=60 mL/min/BSA    ALBUMIN 4.6 3.5 - 5.0 g/dL     CALCIUM 9.8 8.5 - 05.3 mg/dL    GLUCOSE 94 65 - 976 mg/dL    ALKALINE PHOSPHATASE 58 40 - 110 U/L    ALT (SGPT) 33 (H) 8 - 22 U/L    AST (SGOT)  39 8 - 45 U/L    BILIRUBIN TOTAL 0.6 0.3 - 1.3 mg/dL    PROTEIN TOTAL 7.9 6.4 - 8.3 g/dL   DRUG SCREEN, NO  CONFIRMATION, URINE   Result Value Ref Range    AMPHETAMINES, URINE Negative Negative    BARBITURATES URINE Negative Negative    BENZODIAZEPINES URINE Negative Negative    BUPRENORPHINE URINE Negative Negative    CANNABINOIDS URINE Negative Negative    COCAINE METABOLITES URINE Negative Negative    METHADONE URINE Negative Negative    OPIATES URINE (LOW CUTOFF) Negative Negative    OXYCODONE URINE Negative Negative    ECSTASY/MDMA URINE Negative Negative    FENTANYL, RANDOM URINE Negative Negative    CREATININE RANDOM URINE 350 (H) 50 - 100 mg/dL   ETHANOL, SERUM   Result Value Ref Range    ETHANOL None Detected    THYROID STIMULATING HORMONE (SENSITIVE TSH)   Result Value Ref Range    TSH 3.463 0.430 - 3.550 uIU/mL   ACETAMINOPHEN LEVEL   Result Value Ref Range    ACETAMINOPHEN LEVEL  <5 (L) 10 - 30 ug/mL    SALICYLATE ACID LEVEL   Result Value Ref Range    SALICYLATE LEVEL <5 (L) 15 - 30 mg/dL   URINALYSIS WITH MICROSCOPIC REFLEX IF INDICATED [facility] ONLY   Result Value Ref Range    COLOR Yellow Light Yellow, Straw, Yellow    APPEARANCE Clear Clear    PH 6.0 <8.0    LEUKOCYTES Negative Negative WBCs/uL    NITRITE Negative     PROTEIN Trace (A) Negative, 10  mg/dL    GLUCOSE Negative Negative mg/dL    KETONES Trace (A) Negative mg/dL    UROBILINOGEN 0.2  <=7.3 mg/dL    BILIRUBIN Negative Negative mg/dL    BLOOD Negative Negative mg/dL    SPECIFIC GRAVITY >=4.193 (H) <1.022   CBC WITH DIFF   Result Value Ref Range    WBC 7.1 3.7 - 11.0 x103/uL    RBC 4.59 3.85 - 5.22 x106/uL    HGB 14.3 11.5 - 16.0 g/dL    HCT 79.0 24.0 - 97.3 %    MCV 93.9 78.0 - 100.0 fL    MCH 31.2 26.0 - 32.0 pg    MCHC 33.2  31.0 - 35.5 g/dL    RDW-CV 46.5 03.5 - 46.5 %    PLATELETS 288 150 - 400 x103/uL    MPV 11.2 8.7 - 12.5 fL    NEUTROPHIL % 67 %    LYMPHOCYTE % 25 %    MONOCYTE % 6 %    EOSINOPHIL % 1 %    BASOPHIL % 1 %    NEUTROPHIL # 4.77 1.50 - 7.70 x103/uL    LYMPHOCYTE # 1.78 1.00 - 4.80 x103/uL    MONOCYTE # 0.41 0.20 - 1.10  x103/uL    EOSINOPHIL # <0.10 <=0.50 x103/uL    BASOPHIL # <0.10 <=0.20 x103/uL    IMMATURE GRANULOCYTE % 0 0 - 1 %    IMMATURE GRANULOCYTE # <0.10 <0.10 x103/uL   COVID-19, FLU A/B, RSV RAPID BY PCR   Result Value Ref Range    SARS-CoV-2 Detected (A) Not Detected    INFLUENZA VIRUS TYPE A Not Detected Not Detected    INFLUENZA VIRUS TYPE B Not Detected Not Detected    RESPIRATORY SYNCTIAL VIRUS (RSV) Not Detected Not Detected   BLUE TOP TUBE   Result Value Ref Range    RAINBOW/EXTRA TUBE AUTO RESULT Yes    LIGHT GREEN TOP TUBE   Result Value Ref Range    RAINBOW/EXTRA TUBE AUTO RESULT Yes    URINALYSIS, MICROSCOPIC   Result Value Ref Range    RBCS 2-5 (A) 0 - 2 /hpf    WBCS 2-5 (A) 0 - 2 /hpf    BACTERIA Slight (A) None /hpf    SQUAMOUS EPITHELIAL 10-20 (A) 0 - 2 /hpf    TRANSITIONAL EPITHELIAL 0-2 0 - 2 /hpf    MUCOUS Marked (A) None, Light /hpf    AMORPHOUS SEDIMENT Slight (A) None /hpf     Labs reviewed and interpreted by me.      ED Progress Note/Medical Decision Making     Orders Placed This Encounter    HCG, URINE QUALITATIVE, PREGNANCY    CBC/DIFF    COMPREHENSIVE METABOLIC PANEL, NON-FASTING    DRUG SCREEN, NO CONFIRMATION, URINE    ETHANOL, SERUM    THYROID STIMULATING HORMONE (SENSITIVE TSH)    ACETAMINOPHEN LEVEL    SALICYLATE ACID LEVEL    URINALYSIS WITH MICROSCOPIC REFLEX IF INDICATED [facility] ONLY    CBC WITH DIFF    COVID-19, FLU A/B, RSV RAPID BY PCR    EXTRA TUBES    BLUE TOP TUBE    LIGHT GREEN TOP TUBE    URINALYSIS, MICROSCOPIC       1355: Patient was initially evaluated by me, possible etiologies for symptoms were discussed with the patient and the need for further evaluation to include labs as ordered above. She was advised that she will meet with a member of the Crisis/SBIRT team who will help in her examination and coordination of care. The patient verbalized understanding and was in agreement with the proposed care plan at this time.    1612: Spoke with crisis who states  that she will speak with the psychiatrist on call to arrange further disposition.    2115:  I have spoken with crisis who advises that the patient has been recommended for inpatient treatment however there are no beds available in our facility.  They are in the process of finding a location that she may be transferred to however due to the lateness they may not be able to find a facility until tomorrow morning.  The case, exam and  disposition has been discussed with Dr. Trinidad Curet (ED attending) and care of this patient is being signed out to him due to end of shift.    Pre-Disposition Vitals:  Filed Vitals:    03/15/20 1345   BP: (!) 148/79   Pulse: 72   Resp: 18   Temp: 37.1 C (98.7 F)   SpO2: 95%       TRANSFER OF CARE     Time:  21:19    I have transferred the care of my patient, Debra Castaneda, to Marijo Conception, MD after throughly discussing patient's condition and plan of care.  This provider will follow up on any ancillary studies and provided any further treatment and management of the patient and ensure proper disposition.      Portions of this note may be dictated using voice recognition software or a dictation   service.  Variances in spelling and vocabulary are possible and unintentional. Not   all errors are caught/corrected. Please notify the Thereasa Parkin if any discrepancies are noted   or if the meaning of any statement is not clear.

## 2020-03-15 NOTE — ED Attending Note (Signed)
Juliane Lack, MD  [facility]  Emergency Department Visit Note    2100: Patient received in sign out from Evlyn Kanner, [place].  Please see their note for full details of patient presentation. At this time, patient is pending crisis disposition    ED PROGRESS NOTE / MEDICAL DECISION MAKING     ED Course as of 03/15/20 2310   Mon Mar 15, 2020   2308 Care will be signed out to Dr. Myrene Buddy at shift change [JG]      ED Course User Index  [JG] Marijo Conception, MD       PRE-DISPOSITION VITALS      Pre-Disposition Vitals:  Vitals:    03/15/20 1345   BP: (!) 148/79   Pulse: 72   Resp: 18   Temp: 37.1 C (98.7 F)   SpO2: 95%   Weight: 60.5 kg (133 lb 6.1 oz)   Height: 1.6 m (5\' 3" )   BMI: 23.68            CLINICAL IMPRESSION     Encounter Diagnosis   Name Primary?    Suicidal ideations Yes        DISPOSITION/PLAN     Pending

## 2020-03-15 NOTE — ED Nurses Note (Signed)
Report from CRISIS worker at this time. Patient has been recommended to come in patient and we are going to begin a bed search at this time.     Patient is having a Bipolar episode with schizoaffective behavior. Patient is calm and cooperative at this time. Drink provided by Information systems manager.

## 2020-03-16 ENCOUNTER — Inpatient Hospital Stay
Admission: RE | Admit: 2020-03-16 | Discharge: 2020-03-22 | DRG: 885 | Disposition: A | Discharge status: Home / Self Care | Payer: Self-pay | Attending: PSYCHIATRY | Admitting: PSYCHIATRY

## 2020-03-16 ENCOUNTER — Encounter (HOSPITAL_COMMUNITY): Payer: Self-pay | Admitting: Psychiatry

## 2020-03-16 ENCOUNTER — Other Ambulatory Visit: Payer: Self-pay

## 2020-03-16 DIAGNOSIS — G4700 Insomnia, unspecified: Secondary | ICD-10-CM | POA: Diagnosis present

## 2020-03-16 DIAGNOSIS — F333 Major depressive disorder, recurrent, severe with psychotic symptoms: Principal | ICD-10-CM | POA: Diagnosis present

## 2020-03-16 DIAGNOSIS — Z8709 Personal history of other diseases of the respiratory system: Secondary | ICD-10-CM

## 2020-03-16 DIAGNOSIS — F39 Unspecified mood [affective] disorder: Secondary | ICD-10-CM | POA: Diagnosis present

## 2020-03-16 DIAGNOSIS — R45851 Suicidal ideations: Secondary | ICD-10-CM | POA: Diagnosis present

## 2020-03-16 DIAGNOSIS — F940 Selective mutism: Secondary | ICD-10-CM | POA: Diagnosis present

## 2020-03-16 DIAGNOSIS — F4312 Post-traumatic stress disorder, chronic: Secondary | ICD-10-CM | POA: Diagnosis present

## 2020-03-16 DIAGNOSIS — F419 Anxiety disorder, unspecified: Secondary | ICD-10-CM | POA: Diagnosis present

## 2020-03-16 DIAGNOSIS — U071 COVID-19: Secondary | ICD-10-CM | POA: Diagnosis present

## 2020-03-16 MED ORDER — LORAZEPAM 2 MG TABLET
2.0000 mg | ORAL_TABLET | Freq: Four times a day (QID) | ORAL | Status: DC | PRN
Start: 2020-03-16 — End: 2020-03-22

## 2020-03-16 MED ORDER — HYDROXYZINE HCL 25 MG TABLET
25.0000 mg | ORAL_TABLET | Freq: Three times a day (TID) | ORAL | Status: DC | PRN
Start: 2020-03-16 — End: 2020-03-22
  Administered 2020-03-17 – 2020-03-21 (×3): 25 mg via ORAL
  Filled 2020-03-16 (×3): qty 1

## 2020-03-16 MED ORDER — HALOPERIDOL LACTATE 5 MG/ML INJECTION SOLUTION
5.0000 mg | Freq: Three times a day (TID) | INTRAMUSCULAR | Status: DC | PRN
Start: 2020-03-16 — End: 2020-03-22

## 2020-03-16 MED ORDER — LORAZEPAM 2 MG/ML INJECTION SOLUTION
2.0000 mg | Freq: Four times a day (QID) | INTRAMUSCULAR | Status: DC | PRN
Start: 2020-03-16 — End: 2020-03-22

## 2020-03-16 MED ORDER — ACETAMINOPHEN 325 MG TABLET
650.0000 mg | ORAL_TABLET | ORAL | Status: DC | PRN
Start: 2020-03-16 — End: 2020-03-22

## 2020-03-16 MED ORDER — ALUMINUM-MAG HYDROXIDE-SIMETHICONE 200 MG-200 MG-20 MG/5 ML ORAL SUSP
30.0000 mL | ORAL | Status: DC | PRN
Start: 2020-03-16 — End: 2020-03-22

## 2020-03-16 MED ORDER — DIPHENHYDRAMINE 50 MG CAPSULE
50.0000 mg | ORAL_CAPSULE | Freq: Four times a day (QID) | ORAL | Status: DC | PRN
Start: 2020-03-16 — End: 2020-03-22

## 2020-03-16 MED ORDER — HALOPERIDOL 5 MG TABLET
5.0000 mg | ORAL_TABLET | Freq: Three times a day (TID) | ORAL | Status: DC | PRN
Start: 2020-03-16 — End: 2020-03-22

## 2020-03-16 MED ORDER — MIRTAZAPINE 15 MG TABLET
7.5000 mg | ORAL_TABLET | Freq: Every evening | ORAL | Status: DC | PRN
Start: 2020-03-16 — End: 2020-03-22

## 2020-03-16 MED ORDER — NICOTINE (POLACRILEX) 2 MG GUM
2.0000 mg | CHEWING_GUM | BUCCAL | Status: DC | PRN
Start: 2020-03-16 — End: 2020-03-22

## 2020-03-16 MED ORDER — MAGNESIUM HYDROXIDE 400 MG/5 ML ORAL SUSPENSION
30.0000 mL | Freq: Every evening | ORAL | Status: DC | PRN
Start: 2020-03-16 — End: 2020-03-22

## 2020-03-16 MED ORDER — DIPHENHYDRAMINE 50 MG/ML INJECTION SOLUTION
50.0000 mg | Freq: Four times a day (QID) | INTRAMUSCULAR | Status: DC | PRN
Start: 2020-03-16 — End: 2020-03-22

## 2020-03-16 NOTE — Behavioral Health (Signed)
Crisis Worker provided [employer] to pt's wife and asked that she apply for pt and request emergency services for psych admission.

## 2020-03-16 NOTE — Behavioral Health (Signed)
Called pts girlfriend, Jearld Shines, who has left the ED for the day. Call was witnessed by second crisis worker, Building services engineer.     Alivia verbally agreed to be appointed pts HCS at this time. [facility] consents were also reviewed with Alivia and she has agreed to all terms/policies. She consented to COVID visitor policy, permission to search belongings, unit polices/procedures, she was made aware of pt rights, and provided additional names for ROI.     Jearld Shines wanted pt to have additional phone numbers:   Tobi Bastos Rayas: [phone]  [facility] [place]: [phone]

## 2020-03-16 NOTE — Behavioral Health (Signed)
Spoke with Konrad Felix at [facility] who stated pt would need insurance in place prior to transfer or she would be considered self pay. They do not do charity beds or have grant funding to assist in anyway with payment.       Called [facility], spoke with Thayer Ohm, who stated they are concerned for how pt will get transportation home from PCU. He also requested first positive covid results and new vitals.

## 2020-03-16 NOTE — ED Attending Note (Signed)
Debra Castaneda, Debra Castaneda  [facility]  Emergency Department Visit Note    ED PROGRESS NOTE / MEDICAL DECISION MAKING     0700: Care of this patient was transferred to me by Dr. Jacqulyn Castaneda (Emergency Medicine). Please see their note for a full H&P. The patient is awaiting disposition.     9924: Patient has informed staff of 02/29/20 COVID test and reports no symptoms.     14:00: The care of this patient will be signed out to Dr. Vonda Castaneda (Emergency Medicine) at this time. This provider will follow up on any ancillary studies and provide any further treatment and management of the patient and ensure proper disposition.    Pre-Disposition Vitals:  Filed Vitals:    03/15/20 1345 03/16/20 0036   BP: (!) 148/79 116/62   Pulse: 72 64   Resp: 18 16   Temp: 37.1 C (98.7 F)    SpO2: 95% 98%       CLINICAL IMPRESSION     Encounter Diagnosis   Name Primary?    Suicidal ideations Yes     DISPOSITION/PLAN     Pending        SCRIBE ATTESTATION STATEMENT  I Debra Castaneda, SCRIBE scribed for [employer], DO.     Documentation assistance provided for Debra Blanks, DO by Debra Castaneda, SCRIBE. Information recorded by the scribe was done at my direction and has been reviewed and validated by me Debra Blanks, DO.

## 2020-03-16 NOTE — ED Nurses Note (Signed)
Report given to Robert RN, Care relinquished

## 2020-03-16 NOTE — Behavioral Health (Signed)
Called [facility], spoke with Malone, who is reviewing pt information. They are requesting documentation of pts first covid test

## 2020-03-16 NOTE — ED Nurses Note (Signed)
Patient resting comfortably in bed with eyes closed. No obvious signs of distress noted. Bilateral chest rise and fall noted. Appears to be sleeping. Awaiting placement at this time.

## 2020-03-16 NOTE — Group Note (Signed)
Group topic:  ACTIVITY GROUP    Date of group:  03/16/2020  Start time of group:  1600  End time of group:  1655                 Summary of group discussion: Facilitated recreational activity to promote exercise/physical movement and prosocial behaviors.        Tomeshia Pizzi  is a 30 y.o. female participating in a activity group.      Comments: Pt had not yet been admitted to the unit at the time of activity.    [name]  03/16/2020, 17:34

## 2020-03-16 NOTE — Behavioral Health (Signed)
Spoke with attending Dr. Jacqulyn Bath and Dr. Clinton Sawyer both are in agreement that pt does not have decision making capacity at this time. HCS will be appointed.     Spoke to pt about inpt behavioral health admission. She did not respond to any statements made.

## 2020-03-16 NOTE — ED Nurses Note (Signed)
EDT rounded to change linens and provide supplies for hygiene. Patient sleeping at this time. New scrubs remain outside of room.

## 2020-03-16 NOTE — Behavioral Health (Signed)
Called [facility], who stated they will review referral. Referral faxed at this time.     Called [facility], spoke with [place], who stated they will review referral. Referral faxed at this time.     [facility]ed [facility], spoke with Misty Stanley, who said they are not currently accepting referrals and will not be taking transfer until at least Thursday     Called [facility], spoke with Eilene Ghazi, who stated they will review referral. Referral faxed at this time.

## 2020-03-16 NOTE — ED Nurses Note (Signed)
Patient resting comfortably in bed with eyes closed. No obvious signs of distress noted. Bilateral chest rise and fall noted. Appears to be sleeping. Awaiting placement at this time.

## 2020-03-16 NOTE — Behavioral Health (Signed)
[facility] called asking about pts insurance. They stated they will list pt as self pay for now but if she applies for medicaid here, they can help her follow up and can consider an agreement with [facility]

## 2020-03-16 NOTE — Behavioral Health (Signed)
[facility] called back to confirm pt still needed placement. This was confirmed and they are reviewing pt information

## 2020-03-16 NOTE — ED Nurses Note (Signed)
Report to [facility] on psych unit.

## 2020-03-16 NOTE — Group Note (Signed)
Group topic:  ACTIVITY GROUP    Date of group:  03/16/2020  Start time of group:  1800  End time of group:  1900                 Summary of group discussion: Facilitated recreational activity to promote prosocial behaviors.        Thelia Tanksley  is a 30 y.o. female participating in a activity group.      Comments: Pt had just been admitted and was giving a box dinner.  Since she was eating, pt was not invited to attend.      [name]  03/16/2020, 19:19

## 2020-03-16 NOTE — Behavioral Health (Signed)
Spoke with [facility] who confirmed pt did test positive on Mar 01, 2020. She is faxing results to ED at this time

## 2020-03-16 NOTE — ED Attending Handoff Note (Signed)
Debra Pacer, MD  [facility]  Emergency Department Visit Note    ED PROGRESS NOTE / MEDICAL DECISION MAKING     21:19: Care of this patient was transferred to me by Donney Dice [facility] Please see their note for a full H&P. The patient is awaiting disposition.     07:00: The care of this patient will be signed out to Dr. Jacqulyn Bath (Emergency Medicine) at this time. This provider will follow up on any ancillary studies and provide any further treatment and management of the patient and ensure proper disposition    Pre-Disposition Vitals:  Filed Vitals:    03/15/20 1345 03/16/20 0036   BP: (!) 148/79 116/62   Pulse: 72 64   Resp: 18 16   Temp: 37.1 C (98.7 F)    SpO2: 95% 98%       CLINICAL IMPRESSION     Encounter Diagnosis   Name Primary?   . Suicidal ideations Yes     DISPOSITION/PLAN     Pending    SCRIBE ATTESTATION STATEMENT  I Abbott Pao, SCRIBE scribed for Debra Pacer, MD.     Documentation assistance provided for Debra Pacer, MD by Abbott Pao, SCRIBE. Information recorded by the scribe was done at my direction and has been reviewed and validated by me Debra Pacer, MD.

## 2020-03-16 NOTE — Care Plan (Signed)
Problem: Adult Behavioral Health Plan of Care  Goal: Plan of Care Review  Outcome: Ongoing (see interventions/notes)  Goal: Patient-Specific Goal (Individualization)  Outcome: Ongoing (see interventions/notes)  Goal: Strengths and Vulnerabilities  Outcome: Ongoing (see interventions/notes)  Goal: Adheres to Safety Considerations for Self and Others  Outcome: Ongoing (see interventions/notes)  Goal: Absence of New-Onset Illness or Injury  Outcome: Ongoing (see interventions/notes)  Goal: Optimized Coping Skills in Response to Life Stressors  Outcome: Ongoing (see interventions/notes)  Goal: Develops/Participates in Therapeutic Alliance to Support Successful Transition  Outcome: Ongoing (see interventions/notes)  Goal: Rounds/Family Conference  Outcome: Ongoing (see interventions/notes)     Problem: Fall Injury Risk  Goal: Absence of Fall and Fall-Related Injury  Outcome: Ongoing (see interventions/notes)

## 2020-03-16 NOTE — Behavioral Health (Signed)
Provided updated to pt and pts girlfriend who is at bedside. Pt stated "I just want to go home." And did not speak for the rest of the conversation. She did not her head yes to her girlfriend being at the bedside.     Her girlfriend stated pts first covid positive test was completed through [place] [place] [facility]. They are on pts phone which she does not have with her.     Girlfriend has concerns about pt having no insurance and staying in the ED this long. She does not want pt to be admitted anywhere for self pay. She stated they did call [employer] but pt does not qualify for medicaid because of her income.

## 2020-03-16 NOTE — Behavioral Health (Signed)
[facility] accepted [facility] by Dr. Alfredo Martinez
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
## 2020-03-16 NOTE — ED Nurses Note (Signed)
Pt lying in bed, awake, but refuses to answer questions.  Awaiting transport to Psych unit.

## 2020-03-16 NOTE — ED Attending Handoff Note (Signed)
Caprice Red, MD  [facility]  Emergency Department Visit Note    ED PROGRESS NOTE / MEDICAL DECISION MAKING     14:00: Care of this patient was transferred to me by Dr. Jacqulyn Bath (Emergency Medicine).  The patient is awaiting disposition.     15:23: I was informed by crisis management that the patient has been accepted for admission to the behavioral health unit of the hospital at this time.     Pre-Disposition Vitals:  Filed Vitals:    03/15/20 1345 03/16/20 0036   BP: (!) 148/79 116/62   Pulse: 72 64   Resp: 18 16   Temp: 37.1 C (98.7 F)    SpO2: 95% 98%       CLINICAL IMPRESSION     Encounter Diagnosis   Name Primary?   . Suicidal ideations Yes     DISPOSITION/PLAN     Admitted      Condition on Disposition: Fair    I, Antonieta Pert II, SCRIBE, scribed for Caprice Red, MD on 03/16/2020 at 2:09 PM.    Documentation assistance provided for Mouhamadou Gittleman, Cristal Deer, MD by scribe Antonieta Pert II, SCRIBE. Information recorded by the scribe was done at my direction and has been reviewed and validated by me, Maren Wiesen, Cristal Deer, MD.

## 2020-03-17 DIAGNOSIS — R45851 Suicidal ideations: Secondary | ICD-10-CM

## 2020-03-17 LAB — LIPID PANEL
CHOL/HDL RATIO: 3.1
CHOLESTEROL: 151 mg/dL (ref 100–200)
HDL CHOL: 49 mg/dL — ABNORMAL LOW (ref >=50)
LDL CALC: 84 mg/dL (ref <100)
NON-HDL: 102 mg/dL (ref <=190)
TRIGLYCERIDES: 92 mg/dL (ref <150)
VLDL CALC: 18 mg/dL (ref <30)

## 2020-03-17 LAB — HGA1C (HEMOGLOBIN A1C WITH EST AVG GLUCOSE)
ESTIMATED AVERAGE GLUCOSE: 108 mg/dL
HEMOGLOBIN A1C: 5.4 % (ref 4.0–5.6)

## 2020-03-17 MED ORDER — RISPERIDONE 1 MG TABLET
1.0000 mg | ORAL_TABLET | Freq: Two times a day (BID) | ORAL | Status: DC
Start: 2020-03-17 — End: 2020-03-22
  Administered 2020-03-17 – 2020-03-22 (×11): 1 mg via ORAL
  Filled 2020-03-17 (×11): qty 1

## 2020-03-17 NOTE — Care Plan (Signed)
Problem: Adult Behavioral Health Plan of Care  Goal: Plan of Care Review  Outcome: Ongoing (see interventions/notes)  Flowsheets (Taken 03/17/2020 1142)  Patient Agreement with Plan of Care: agrees  Plan of Care Reviewed With: patient  Note: Pt and this writer mutually established care plan goals for treatment.    Goal: Patient-Specific Goal (Individualization)  Outcome: Ongoing (see interventions/notes)  Goal: Strengths and Vulnerabilities  Outcome: Ongoing (see interventions/notes)  Flowsheets (Taken 03/17/2020 1142)  Patient Personal Strengths:   family/social support   stable living environment   positive vocational history  Patient Vulnerabilities:   adverse childhood experience(s)   history of unsuccessful treatment   traumatic event  Goal: Adheres to Safety Considerations for Self and Others  Outcome: Ongoing (see interventions/notes)  Goal: Absence of New-Onset Illness or Injury  Outcome: Ongoing (see interventions/notes)  Goal: Optimized Coping Skills in Response to Life Stressors  Outcome: Ongoing (see interventions/notes)  Goal: Develops/Participates in Therapeutic Alliance to Support Successful Transition  Outcome: Ongoing (see interventions/notes)  Intervention: Doctor, general practice (Taken 03/17/2020 1142)  Trust Relationship/Rapport:   care explained   choices provided   emotional support provided   empathic listening provided   thoughts/feelings acknowledged   reassurance provided   questions encouraged   questions answered  Note: This Probation officer met with pt to complete intake process.    Pre admission screening reviewed and updated.  See note.  Care plan Developed  Discharge plans discussed    Intervention: Mutually Develop Transition Plan  Flowsheets (Taken 03/17/2020 1142)  Transition Support: follow-up care discussed  Current Discharge Risk: psychiatric illness  Readmission Within the Last 30 Days: no previous admission in last 30 days  Outpatient/Agency/Support Group Needs:   outpatient  medication management   outpatient counseling  Anticipated Discharge Disposition: home with family  Transportation Anticipated: family or friend will provide  Patient/Family Anticipated Services at Transition: mental health services  Patient/Family Anticipates Transition to: home with family  Concerns to be Addressed:   cognitive/perceptual   discharge planning   coping/stress   medication   mental health  Patient's Choice of Community Agency(s): To be determined based upon insurance status.  Note: Available outpatient psychiatry and therapy options were discussed.      Outpatient Follow up Arrangements:  Referral options to be explored.    Living Situation:  Return home when stable for discharge.    Goal: Rounds/Family Conference  Outcome: Ongoing (see interventions/notes)  Flowsheets (Taken 03/17/2020 1142)  Participants:   patient   social work/services  Note: Met for introductory and care planning purposes.    Discussed the importance of participation and attendance in milieu activities as well as treatment groups.       Problem: Fall Injury Risk  Goal: Absence of Fall and Fall-Related Injury  Outcome: Ongoing (see interventions/notes)     Problem: Mood Impairment (Manic or Hypomanic Signs/Symptoms)  Goal: Improved Mood Symptoms (Manic/Hypomanic Signs/Symptoms)  Outcome: Ongoing (see interventions/notes)  Note: Pt has a long history of bipolar disorder and post traumatic stress.  Pt is presently presenting as minimally engaged and catatonic.    GOAL:    Pt will express an understanding of the need for mood-stabilizing medication and cooperate with the prescribed treatment.  Pt will demonstrate more clear speech and thought processes that are less pressured, less tangential and less disorganized.  Pt will display stabilized mood, normal activity levels and goal-directed behavior.       Problem: Psychomotor Impairment (Manic or Hypomanic Signs/Symptoms)  Goal: Improved Psychomotor Symptoms (Manic/Hypomanic  Signs/Symptoms)  Outcome: Ongoing (see interventions/notes)  Note: Pt has been presenting in a largely catatonic state with psychomotor retardation.    GOAL:  Pt will take medication daily as prescribed.  Pt will display less bizarre content of thoughts.   Pt will display increased logical speech patterns.    Pt will return to baseline level of functioning in affect, thinking and relating.

## 2020-03-17 NOTE — Group Note (Signed)
Psychoeducational Group Note  Date of group:  03/17/2020  Start time of group:  1445  End time of group:  1525               Summary of group discussion: Per the request of the group, this group focused around the topic of distress tolerance, and involved psychoeducation on the aforementioned, and guided group discussion. All patients in group were positioned 6 feet apart. All staff wore masks, and all patients were offered them.         Patient was invited to group by staff, but did not attend.     Debra Castaneda 03/17/2020 16:44

## 2020-03-17 NOTE — Group Note (Signed)
Group topic:  ACTIVITY GROUP    Date of group:  03/17/2020  Start time of group:  1300  End time of group:  1400                 Summary of group discussion: Facilitated recreational craft activity to introduce healthy coping skill and bolster prosocial behavior.       Debra Castaneda  is a 30 y.o. female participating in a activity group.    Comments: Pt was invited but did not attend    [name]  03/17/2020, 14:22

## 2020-03-17 NOTE — Group Note (Signed)
Group topic:  EXPERIENTIAL THERAPY/ACTIVITY GROUP    Date of group:  03/17/2020  Start time of group:  1025  End time of group:  1050               Summary of group discussion:  Conducted experiential therapy group using DBT and person centered strategies to increase knowledge of mindfulness and practice strategies.  Patients played "Mindful Talk" group discussion card game to explore present emotions, foster mindfulness, generate personal inquiry, and practice expression of emotions.  *All patients in group were reminded to follow COVID 19 social distancing and masking guidelines       Pt was invited to group, but did not attend.      Nicasio Barlowe, LPC  03/17/2020, 11:04

## 2020-03-17 NOTE — Behavioral Health (Signed)
[facility]  [place], [place]  [id number]  Behavioral Health - Initial Therapy Intake Note    Navya Timmons   DOB:  28-Oct-1990        CHIEF COMPLAINT: "I want the confusion to go away"      SUBJECTIVE: Met with [facility] for initial therapy services intake.  Reviewed initial intake, completed by BHCW, Redmon, Nannette, with patient and any revisions/additions are listed below.    Additional information to note: Pt was responsive to this Probation officer for a few minutes but became nonverbal after some time. Pt was able to answer a few question before it seemed she was responding to internal stimuli and stopped responding to this Probation officer. Pt stated that she was confused and "everyone was talking at the same time." Pt stated " I want to go home". Pt reported her goals for her stay at [facility] was to "make the confusion go away". Pt was asked if she was currently having any suicidal thoughts and pt shook her head yes. This Probation officer asked pt if she had any current plan and she shook her head no. This Probation officer asked pt if she was having any HI and pt stated no. Pt was unable to answer any further questions.     Any Spiritual/Cultural/Religious preferences:  No    Military History: Unable to assess    Minor Children in your home?  Who is caring for them while hospitalized: Unable to assess      OBJECTIVE:     Orientation: Unable to assess orientation.  Marland Kitchen    Appearance:  appears actual age and appears distressed.    Eye Contact:  poor and shifting gaze .    Behavior:  poor social reciprocity.    Attention:  severely impaired.    Speech:  patient is nonverbal.    Motor:  psychomotor retardation.    Mood:   Unable to assess .    Affect:  appears anxious and appears sad.    Thought Process:  unable to assess.    Thought Content:  unable to assess.    Suicidal Ideation:  ideations--no plan.    Homicidal Ideation:  none  Perception:  unable to assess  Cognition:  impaired .    Insight:  unable to assess.    Judgement:   unable to assess.    Other objective findings:  Pt seemed to be responding to internal stimuli.      ASSESSMENT:     Patient's perception of treatment is:  Unable to assess    Recommendations for treatment:  medication management, group therapy, family therapy, mood improvement, mood stabilization, obtain collateral information to reflect patient's baseline level of functioning, increase coping skills, ongoing discharge planning    Currently established with an outpatient provider?  No, referral desired Case management      PROCEDURE: Utilized Cognitive Behavioral Therapy (CBT) techniques, Motivational Interviewing strategies, person centered therapy, and solution focused strategies throughout encounter.      PLAN: Patient is to continue to participate in therapy and milieu programming as scheduled.  Patient will actively work with multi-disciplinary treatment team to achieve treatment goals.  Discharge planning remains ongoing.  Referrals needed for:  Psychiatry and [place], [place] 03/17/2020 09:49

## 2020-03-17 NOTE — Behavioral Health (Signed)
Spoke to pt regarding case management needs. Pt unable to engage in assessment at this time, so the following information gathered from EMR and from primary therapist's conversation with [facility]    Insurance: Patient does not have insurance and will require screened for [facility]   Housing: Lives with significant other (HCS)  Transportation: Provided by significant other (HCS)  Legal Concerns: None known   Employment Concerns: Works for the [employer] and [employer], and will require 2 work notes upon discharge   Follow Up: To be determined after Medicaid eligibility is determined.   Other Concerns: None noted at this time.

## 2020-03-17 NOTE — Group Note (Signed)
Group topic:  ACTIVITY GROUP    Date of group:  03/17/2020  Start time of group:  1800  End time of group:  1900                 Summary of group discussion: Facilitated recreational activity to promote prosocial behaviors.        Kambra Beachem  is a 30 y.o. female participating in a activity group.      Comments: Pt was invited but did not attend    [name]  03/17/2020, 19:04

## 2020-03-17 NOTE — Group Note (Signed)
Group topic:  ACTIVITY GROUP    Date of group:  03/17/2020  Start time of group:  1600  End time of group:  1645                 Summary of group discussion: Facilitated recreational activity to promote exercise/physical movement and prosocial behaviors.        Debra Castaneda  is a 30 y.o. female participating in a activity group.      Comments: Pt was invited but did not attend    [name]  03/17/2020, 16:56

## 2020-03-17 NOTE — Group Note (Signed)
Group topic:  MUSIC THERAPY GROUP    Date of group:  03/17/2020  Start time of group:  1100  End time of group:  1200      Group Summary: Patients participated in group music therapy session. Patients engaged in Doctor, hospital, and receptive music intervention: Administrator, Civil Service. Patients were provided instructions and prompt sheet with storytelling elements: characters, setting, conflict, resolution, etc, and music soundscape. Patients created stories based on active listening experience. Patients were given opportunity to process experience and share story post-intervention.       Lameshia Hypolite  is a 30 y.o. female participating in a music group.      Comments: Patient was invited to group but did not attend.     Tonye Becket, MMT, MT-BC  03/17/2020, 13:07
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
## 2020-03-18 DIAGNOSIS — F4312 Post-traumatic stress disorder, chronic: Secondary | ICD-10-CM

## 2020-03-18 DIAGNOSIS — F29 Unspecified psychosis not due to a substance or known physiological condition: Secondary | ICD-10-CM

## 2020-03-18 NOTE — Progress Notes (Signed)
[facility]  Psychiatric Inpatient Progress Note  Name: Debra Castaneda  MRN: [id number]  DOB: 1990-07-07  Date of Service: 03/18/2020    CC:   "Groggy".     Interval Hx:  Debra Castaneda is a 30 y.o. female with h/o confusion, psychosis, possible thought blocking, chronic PTSD.    Patient was evaluated and discussed with the multidisciplinary team. She was reportedly selectively mute. Pt answered the following questions with a very soft voice: she reported feeling "groggy". Denied SI, denied A/V hallucinations, denied pain, denied nightmares. She is wearing her own clothing, in bed. No EPS/TD noted nor c/o. Reportedly ate some bkft. Not attending groups yet, not in the milieu.    I explained her medications to her. She nodded in approval.      Review of System:  Pain-patient rates pain today at 0 on a 10 point scale (10= worst)  Constitutional-no evidence of fever, congestion, or other complaints  Eyes-no evidence of visual problems  ENT-no evidence of hearing deficit, tinnitus, sore throat  Cardiovascular-no evidence of chest pain, palpitations, or edema  Respiratory-no evidence of cough or dyspnea  GI-no evidence of nausea, vomiting, or diarrhea  GU-no evidence of dysuria or increased frequency  Musculoskeletal-no evidence of myalgias or weakness  Skin-no evidence of rash or excoriation  Neuro-no evidence of headaches, dizziness or tremor, no stiffness, no difficulties with movement, swallowing food, no drooling    Physical Exam:Gait- Normal; no evidence of changes in gait/ambulation; no assistive devices used    Filed Vitals:    03/16/20 1716 03/17/20 0600 03/18/20 0600   BP: 123/72 116/71 103/60   Pulse: 79 72 60   Resp: 16 16 16    Temp:  36.7 C (98 F) 36.9 C (98.4 F)   SpO2: 99%  97%       Labs:   Lab Results   Component Value Date    HA1C 5.4 03/17/2020       CBC  Diff   Lab Results   Component Value Date/Time    WBC 7.1 03/15/2020 02:09 PM    HGB 14.3 03/15/2020 02:09 PM    HCT 43.1  03/15/2020 02:09 PM    PLTCNT 288 03/15/2020 02:09 PM    RBC 4.59 03/15/2020 02:09 PM    MCV 93.9 03/15/2020 02:09 PM    MCHC 33.2 03/15/2020 02:09 PM    MCH 31.2 03/15/2020 02:09 PM    MPV 11.2 03/15/2020 02:09 PM    Lab Results   Component Value Date/Time    PMNS 67 03/15/2020 02:09 PM    MONOCYTES 6 03/15/2020 02:09 PM    BASOPHILS 1 03/15/2020 02:09 PM    BASOPHILS <0.10 03/15/2020 02:09 PM    PMNABS 4.77 03/15/2020 02:09 PM    LYMPHSABS 1.78 03/15/2020 02:09 PM    EOSABS <0.10 03/15/2020 02:09 PM    MONOSABS 0.41 03/15/2020 02:09 PM          Comprehensive Metabolic Profile    Lab Results   Component Value Date/Time    SODIUM 140 03/15/2020 02:09 PM    POTASSIUM 4.7 03/15/2020 02:09 PM    CHLORIDE 104 03/15/2020 02:09 PM    CO2 26 03/15/2020 02:09 PM    ANIONGAP 10 03/15/2020 02:09 PM    BUN 14 03/15/2020 02:09 PM    CREATININE 0.77 03/15/2020 02:09 PM    GLUCOSE Negative 03/15/2020 02:10 PM    Lab Results   Component Value Date/Time    CALCIUM 9.8 03/15/2020 02:09 PM  ALBUMIN 4.6 03/15/2020 02:09 PM    TOTALPROTEIN 7.9 03/15/2020 02:09 PM    ALKPHOS 58 03/15/2020 02:09 PM    AST 39 03/15/2020 02:09 PM    ALT 33 (H) 03/15/2020 02:09 PM    TOTBILIRUBIN 0.6 03/15/2020 02:09 PM            Hepatic Function    Lab Results   Component Value Date/Time    ALBUMIN 4.6 03/15/2020 02:09 PM    TOTALPROTEIN 7.9 03/15/2020 02:09 PM    ALKPHOS 58 03/15/2020 02:09 PM    Lab Results   Component Value Date/Time    AST 39 03/15/2020 02:09 PM    ALT 33 (H) 03/15/2020 02:09 PM            Lab Results   Component Value Date    CHOLESTEROL 151 03/17/2020    HDLCHOL 49 (L) 03/17/2020    LDLCHOL 84 03/17/2020    TRIG 92 03/17/2020      Lab Results   Component Value Date    COVID19PCR Detected (A) 03/15/2020     COVID-19  No fever  No cough  No SOB  No headache  No loss of taste or smell  No recent travel      Medications:  acetaminophen (TYLENOL) tablet, 650 mg, Oral, Q4H PRN  aluminum-magnesium hydroxide-simethicone (MAG-AL PLUS)  200-200-20 mg per 5 mL oral liquid, 30 mL, Oral, Q4H PRN  diphenhydrAMINE (BENADRYL) capsule, 50 mg, Oral, Q6H PRN   Or  diphenhydrAMINE (BENADRYL) 50 mg/mL injection, 50 mg, IntraMUSCULAR, Q6H PRN  haloperidol (HALDOL) tablet, 5 mg, Oral, Q8H PRN   Or  haloperidol (HALDOL) 5 mg/mL injection, 5 mg, IntraMUSCULAR, Q8H PRN  hydrOXYzine (ATARAX) tablet, 25 mg, Oral, Q8H PRN  LORazepam (ATIVAN) tablet, 2 mg, Oral, Q6H PRN   Or  LORazepam (ATIVAN) 2 mg/mL injection, 2 mg, IntraMUSCULAR, Q6H PRN  magnesium hydroxide (MILK OF MAGNESIA) 400mg  per 85mL oral liquid, 30 mL, Oral, HS PRN  mirtazapine (REMERON) tablet, 7.5 mg, Oral, HS PRN - MR x 1  nicotine polacrilex (NICORETTE) chewing gum, 2 mg, Oral, Q1H PRN  risperiDONE (RISPERDAL) tablet, 1 mg, Oral, 2x/day          ALLERGIES:  Allergies   Allergen Reactions    Lactose  Other Adverse Reaction (Add comment)     Upset stomach; diarrhea    Penicillins     Pork/Porcine Containing Products      Upset stomach         PROBLEM LIST CONSIDERED IN MEDICAL DESCION MAKING AND COMPLEXITY:  Patient Active Problem List    Diagnosis     Unspecified mood (affective) disorder (CMS HCC)          MENTAL STATUS EXAMINATION:  Patient is dressed appropriately, appears stated age.  Eye contact and rapport better than yesterday. Grooming and hygiene-Good  Patient is cooperative, pleasant during the interview and is not in any apparent distress.  No involuntary movements.  Speech- a possible reflection of thought blocking.  Reaction time-normal if answers a question  Mood: Anxious, subdued  Affect:  Blunted  Thought process: Confused, disorganized  Thought content:  Denies suicidal or homicidal thoughts, plans or intent.  Denies paranoid thoughts, delusions, obsessions, thought insertion, withdrawal or broadcasting.  Perception:  Denies A/V/T/G/O hallucinations.  Patient is not observed responding to internal stimuli.  Cognition:  Alert, oriented x2, attention and concentration-poorly sustained  Memory-immediate, recent, remote-unable to ascertain  Insight-impaired today  Judgment-impaired today    Diagnoses:  Confusion  and A/V hallucinations                 R/O Schizoprenia                R/O Schizoaffective disorder, depressed type                R/O organic cause for psychosis  Chronic PTSD     Plan:     - Legal status: voluntary.      - Observation status- Q 15 mins     - Meds -   Psychosis  1. Cont risperidone 1mg  po BID     - Medical comorbidities -   None known       - Psychoeducation -      - Risks and benefits of medications discussed and patient verbalized understanding and consented.    - Patient participated in the treatment plan.      - Psychotherapy - encouraged to attend all groups; provide milieu therapy and brief individual therapy as appropriate.     -  Disposition: Patient will return to staying with gf Alivia    - LOS: 5-7 days    , NP 03/18/2020, 12:05    I did not see or examine the patient.  I reviewed the Nurse Practitioner's note.  I agree with the findings and plan of care as documented in the Nurse Practitioner's note.  Any exceptions/additions are edited/noted.     05/16/2020, MD  03/18/2020, 17:11

## 2020-03-18 NOTE — Care Plan (Signed)
Problem: Psychomotor Impairment (Manic or Hypomanic Signs/Symptoms)  Goal: Improved Psychomotor Symptoms (Manic/Hypomanic Signs/Symptoms)  Flowsheets (Taken 03/18/2020 0017)  Mutually Determined Action Steps (Improved Psychomotor Symptoms): adheres to medication regimen

## 2020-03-18 NOTE — Behavioral Health (Signed)
Spoke to [place] from financial counseling regarding patient medicaid eligibility; it is reported patient is over income for eligibility

## 2020-03-18 NOTE — Nurses Notes (Signed)
Patient in bed majority of evening. Compliant with scheduled medications. Patient is mute and avoids eye contact. Able to comprehend and follow spoken direction. She had snacks and soda and returned to bed. Sleeping at this time. Fifteen minute checks continue.

## 2020-03-18 NOTE — Group Note (Signed)
Group topic:  ACTIVITY GROUP    Date of group:  03/18/2020  Start time of group:  1300  End time of group:  1400                 Summary of group discussion: Facilitated recreational craft activity to introduce healthy coping skill and bolster prosocial behavior.       Debra Castaneda  is a 30 y.o. female participating in a activity group.      Comments: Pt was invited but did not attend    [name]  03/18/2020, 14:35

## 2020-03-18 NOTE — Nurses Notes (Signed)
The patient has been up on the unit for meals. The patient's affect remains flat and interactions very minimal. The patient did ask if she could brush her teeth and is mildly making needs known. The patient denies any physical symptoms such as gi/gu symptoms, pn, numbness/tingling. The patient is guarded when asked psychiatric questions. The patient is groomed, but unkempt. The patient has been and will continue to be monitored q15 and assessed as needed.

## 2020-03-18 NOTE — Group Note (Signed)
Group topic:  ACTIVITY GROUP    Date of group:  03/18/2020  Start time of group:  1600  End time of group:  1630                 Summary of group discussion: Facilitated recreational activity to promote exercise/physical movement and prosocial behaviors.        Terrilee Dudzik  is a 30 y.o. female participating in a activity group.      Comments: Pt was invited but did not attend    [name]  03/18/2020, 16:43

## 2020-03-18 NOTE — Group Note (Signed)
Group topic:  ACTIVITY GROUP    Date of group:  03/18/2020  Start time of group:  1800  End time of group:  1900                 Summary of group discussion: Facilitated recreational activity to promote prosocial behaviors.        Debra Castaneda  is a 30 y.o. female participating in a activity group.      Comments: Pt was invited but did not attend    [name]  03/18/2020, 19:01

## 2020-03-18 NOTE — Group Note (Signed)
Group topic:  EXPERIENTIAL THERAPY/ACTIVITY GROUP    Date of group:  03/18/2020  Start time of group:  1015  End time of group:  1045           Summary of group discussion: Facilitated experiential therapy group using CBT and person centered strategies.  Group focused on creating a forward-thinking outlook.  Patients engaged in the "Think Twice In a Jar" decision making game to promote critical thinking, healthy communication, laughter and social connection.  Group activity aided in fostering resilience and expanding coping strategies.  Group discussion was used to promote decision-making and identification of action steps to achieve goals.  *All patients in group were reminded to follow COVID 19 social distancing guidelines.   Staff wore facemasks, patients were offered masks and encouraged to wear facemasks throughout group.       Pt was invited to group, but did not attend.       Yitta Gongaware, CLINICAL THERAPIST  03/18/2020, 11:02

## 2020-03-18 NOTE — Group Note (Signed)
Group topic:  MUSIC THERAPY GROUP    Date of group:  03/18/2020  Start time of group:  1100  End time of group:  1200      Group Summary: Patients participated in group music therapy session. Patients engaged in synchronized and improvisational group drumming, and blues songwriting workshop to support self-expression and emotional awareness. Patients were given opportunity to create and perform original songs that convey personal insights and awareness. MT accompanied on guitar and provided supports as necessary for patients to successfully engage in intervention.       Debra Castaneda  is a 30 y.o. female participating in a music group.      Comments: Patient was invited to group but did not attend.    Tonye Becket, MMT, MT-BC  03/18/2020, 13:49

## 2020-03-19 DIAGNOSIS — F333 Major depressive disorder, recurrent, severe with psychotic symptoms: Principal | ICD-10-CM

## 2020-03-19 MED ORDER — SERTRALINE 50 MG TABLET
25.0000 mg | ORAL_TABLET | Freq: Every evening | ORAL | Status: DC
Start: 2020-03-19 — End: 2020-03-22
  Administered 2020-03-19 – 2020-03-21 (×3): 25 mg via ORAL
  Filled 2020-03-19 (×3): qty 1

## 2020-03-19 MED ORDER — MELATONIN 3 MG TABLET
6.0000 mg | ORAL_TABLET | Freq: Every evening | ORAL | Status: DC | PRN
Start: 2020-03-19 — End: 2020-03-19

## 2020-03-19 MED ORDER — MELATONIN 3 MG TABLET
6.0000 mg | ORAL_TABLET | Freq: Every evening | ORAL | Status: DC
Start: 2020-03-19 — End: 2020-03-22
  Administered 2020-03-19: 6 mg via ORAL
  Administered 2020-03-19 – 2020-03-20 (×2): 0 mg via ORAL
  Administered 2020-03-20: 6 mg via ORAL
  Administered 2020-03-21: 0 mg via ORAL
  Administered 2020-03-21: 6 mg via ORAL
  Administered 2020-03-21: 22:00:00 0 mg via ORAL
  Filled 2020-03-19 (×3): qty 2

## 2020-03-19 NOTE — Nurses Notes (Signed)
Patient initially isolative to room. She did attend wrap-up group and spend time walking the unit. States her mood is "ok", affect is flat. Vocal with nurse this evening. Compliant with meds. Patient asked why she was here and when she could leave. Denies hallucinations, SI/HI. Appears preoccupied, grimacing and thought blocking. Patient had snacks and soda this evening. Sleeping well through the night. Fifteen minute checks continue.

## 2020-03-19 NOTE — Group Note (Signed)
Group topic:  PROCESS THERAPY    Date of group:  03/19/2020  Start time of group:  1440  End time of group:  1530               Summary of group discussion:  Facilitated process therapy group using CBT, SFBT and person centered strategies.  Utilized Arboriculturist Introductions" group icebreaker activity.  Group focused on exploration of current mood, treatment progress made thus far, and identified continued treatment goals.  Patients discussed what they would like to continue to work on in treatment and identified an action step that they could take today to work towards personal goals.  Group members completed "The Resilience Worksheet checklist" to explore areas in their life that they have strengths and areas noted for improvement.  Patients then identified one thing they can do to foster increased health and wellness.  Group worked to create a forward thinking outlook, facilitate behavioral change, and increase overall resilience and distress tolerance skills.  *All patients in group were reminded to follow COVID 19 social distancing and masking guidelines.        Debra Castaneda  is a 30 y.o. female participating in a process group.    Affect/Mood:  Blunted    Thought Process:  Goal directed    Thought Content:  Within normal limits and Limited    Interpersonal:  Discussed issues, Attentive, Isolative and Limited insight    Level of participation:  Full    Comments:  Pt shared minimally with prompting.      Mathis Fare, [facility]  03/19/2020, 15:46

## 2020-03-19 NOTE — Progress Notes (Signed)
[facility]  Psychiatric Inpatient Progress Note  Name: Malayia Spizzirri  MRN: [id number]  DOB: 03-18-1990  Date of Service: 03/19/2020    CC:   "I know I'm in a hospital somewhere."    Interval Hx:  Debra Castaneda is a 30 y.o. female with h/o confusion, psychosis, possible thought blocking, chronic PTSD.    Patient was evaluated and discussed with the multidisciplinary team. She was reportedly behaving oddly this morning, observed by staff as skulking by the med room door's window, and walking back and forth by the therapists' office making eye contact but not stopping or saying anything. I found pt sitting in the lounge looking at books to read. She was the most verbal today since admission. She did not recognize me from caring for her over the past 2 days so I introduced myself again. She reported that she is basically always really depressed. She had 10 sessions of ECT a "couple of years ago", and doesn't remember anything for the three months after that. She was discharged from one hospital and immediately admitted to another hospital, both in [place]. She shared that during episodes of confusion, she doesn't remember things, and she can feel like she is not real, and life isn't real. She also gets confused because "so many people are talking to me all at once and I don't know who to listen to", describing auditory hallucinations. She was making good eye contact and all of a sudden furrowed her brow which informed me she was having auditory hallucinations at that moment. When I asked her what she just heard, she couldn't answer. She is aware she is in a hospital "somewhere". I told her which hospital we were in and where we were located. I asked her if she would be able to return to Alivia's home to live and pt said "If she's not too mad at me". Pt reported she and Alivia have known each other for years. Pt denied SI/HI, denied auditory and visual hallucinations but then was responding  to internal stimuli. She is improving, but remains psychotic, and very depressed. She was moving her legs and wringing her hands during our conversation. She was very sweet, soft spoken, and made good eye contact. She also reported no appetite but she ate a little bit anyway because someone told her if she wanted to "get out of here, she had to eat". Pt did ask when she could go home. I explained that that would depend on how she is doing, but we could think about it on Monday. She nodded in agreement. Not attending groups because "I'm not comfortable around other people". Had trouble sleeping last night because of bad dreams.     We discussed and agreed to start a low dose of an antidepressant, and she asked for something for sleep. Unfortunately, prazosin has pork in it, and pt is allergic to pork. Therefore, she will be started on sertraline 25 mg po nightly and melatonin 6 mg po nightly, MRx1.       Review of System:  Pain-patient rates pain today at 0 on a 10 point scale (10= worst)  Constitutional-no evidence of fever, congestion, or other complaints  Eyes-no evidence of visual problems  ENT-no evidence of hearing deficit, tinnitus, sore throat  Cardiovascular-no evidence of chest pain, palpitations, or edema  Respiratory-no evidence of cough or dyspnea  GI-no evidence of nausea, vomiting, or diarrhea  GU-no evidence of dysuria or increased frequency  Musculoskeletal-no evidence of myalgias or  weakness  Skin-no evidence of rash or excoriation  Neuro-no evidence of headaches, dizziness or tremor, no stiffness, no difficulties with movement, swallowing food, no drooling    Physical Exam:Gait- Normal; no evidence of changes in gait/ambulation; no assistive devices used    Filed Vitals:    03/17/20 0600 03/18/20 0600 03/18/20 1735 03/19/20 0631   BP: 116/71 103/60 113/71 107/63   Pulse: 72 60 78 79   Resp: 16 16 14 16    Temp: 36.7 C (98 F) 36.9 C (98.4 F) 37.1 C (98.7 F) 36.3 C (97.3 F)   SpO2:  97% 100%  100%       Labs:   Lab Results   Component Value Date    HA1C 5.4 03/17/2020       CBC  Diff   Lab Results   Component Value Date/Time    WBC 7.1 03/15/2020 02:09 PM    HGB 14.3 03/15/2020 02:09 PM    HCT 43.1 03/15/2020 02:09 PM    PLTCNT 288 03/15/2020 02:09 PM    RBC 4.59 03/15/2020 02:09 PM    MCV 93.9 03/15/2020 02:09 PM    MCHC 33.2 03/15/2020 02:09 PM    MCH 31.2 03/15/2020 02:09 PM    MPV 11.2 03/15/2020 02:09 PM    Lab Results   Component Value Date/Time    PMNS 67 03/15/2020 02:09 PM    MONOCYTES 6 03/15/2020 02:09 PM    BASOPHILS 1 03/15/2020 02:09 PM    BASOPHILS <0.10 03/15/2020 02:09 PM    PMNABS 4.77 03/15/2020 02:09 PM    LYMPHSABS 1.78 03/15/2020 02:09 PM    EOSABS <0.10 03/15/2020 02:09 PM    MONOSABS 0.41 03/15/2020 02:09 PM          Comprehensive Metabolic Profile    Lab Results   Component Value Date/Time    SODIUM 140 03/15/2020 02:09 PM    POTASSIUM 4.7 03/15/2020 02:09 PM    CHLORIDE 104 03/15/2020 02:09 PM    CO2 26 03/15/2020 02:09 PM    ANIONGAP 10 03/15/2020 02:09 PM    BUN 14 03/15/2020 02:09 PM    CREATININE 0.77 03/15/2020 02:09 PM    GLUCOSE Negative 03/15/2020 02:10 PM    Lab Results   Component Value Date/Time    CALCIUM 9.8 03/15/2020 02:09 PM    ALBUMIN 4.6 03/15/2020 02:09 PM    TOTALPROTEIN 7.9 03/15/2020 02:09 PM    ALKPHOS 58 03/15/2020 02:09 PM    AST 39 03/15/2020 02:09 PM    ALT 33 (H) 03/15/2020 02:09 PM    TOTBILIRUBIN 0.6 03/15/2020 02:09 PM            Hepatic Function    Lab Results   Component Value Date/Time    ALBUMIN 4.6 03/15/2020 02:09 PM    TOTALPROTEIN 7.9 03/15/2020 02:09 PM    ALKPHOS 58 03/15/2020 02:09 PM    Lab Results   Component Value Date/Time    AST 39 03/15/2020 02:09 PM    ALT 33 (H) 03/15/2020 02:09 PM            Lab Results   Component Value Date    CHOLESTEROL 151 03/17/2020    HDLCHOL 49 (L) 03/17/2020    LDLCHOL 84 03/17/2020    TRIG 92 03/17/2020      Lab Results   Component Value Date    COVID19PCR Detected (A) 03/15/2020     COVID-19  No  fever  No cough  No SOB  No headache  No loss of taste or smell  No recent travel      Medications:  acetaminophen (TYLENOL) tablet, 650 mg, Oral, Q4H PRN  aluminum-magnesium hydroxide-simethicone (MAG-AL PLUS) 200-200-20 mg per 5 mL oral liquid, 30 mL, Oral, Q4H PRN  diphenhydrAMINE (BENADRYL) capsule, 50 mg, Oral, Q6H PRN   Or  diphenhydrAMINE (BENADRYL) 50 mg/mL injection, 50 mg, IntraMUSCULAR, Q6H PRN  haloperidol (HALDOL) tablet, 5 mg, Oral, Q8H PRN   Or  haloperidol (HALDOL) 5 mg/mL injection, 5 mg, IntraMUSCULAR, Q8H PRN  hydrOXYzine (ATARAX) tablet, 25 mg, Oral, Q8H PRN  LORazepam (ATIVAN) tablet, 2 mg, Oral, Q6H PRN   Or  LORazepam (ATIVAN) 2 mg/mL injection, 2 mg, IntraMUSCULAR, Q6H PRN  magnesium hydroxide (MILK OF MAGNESIA) 400mg  per 28mL oral liquid, 30 mL, Oral, HS PRN  melatonin tablet, 6 mg, Oral, HS - MR x 1  mirtazapine (REMERON) tablet, 7.5 mg, Oral, HS PRN - MR x 1  nicotine polacrilex (NICORETTE) chewing gum, 2 mg, Oral, Q1H PRN  risperiDONE (RISPERDAL) tablet, 1 mg, Oral, 2x/day  sertraline (ZOLOFT) tablet, 25 mg, Oral, NIGHTLY          ALLERGIES:  Allergies   Allergen Reactions    Lactose  Other Adverse Reaction (Add comment)     Upset stomach; diarrhea    Penicillins     Pork/Porcine Containing Products      Upset stomach         PROBLEM LIST CONSIDERED IN MEDICAL DESCION MAKING AND COMPLEXITY:  Patient Active Problem List    Diagnosis     Unspecified mood (affective) disorder (CMS HCC)          MENTAL STATUS EXAMINATION:  Patient is dressed appropriately, appears stated age.  Eye contact and rapport good today. Grooming and hygiene-Good  Patient is cooperative, pleasant during the interview.  No involuntary movements.  Speech- Soft spoken, normal rate and tone.  Reaction time-normal.  Mood: Severe depression and anxiety  Affect:  Blunted  Thought process: Less confused, not disorganized.   Thought content:  Denies suicidal or homicidal thoughts, plans or intent.  Denies paranoid thoughts,  delusions, obsessions, thought insertion, withdrawal or broadcasting.  Perception:  Denies hallucinations but I observed her reacting in such a way as to point to both auditory and visual hallucinations.  Patient is observed responding to internal stimuli.  Cognition:  Alert, oriented x3, attention and concentration-better sustained Memory-immediate, recent, remote-intact other than post ECT 3 months  Insight-fair  Judgment-fair    Diagnoses:  MDD rec sev with +SI, Confusion and A/V hallucinations, SI resolved,  Psychosis resolving  Chronic PTSD     Plan:     - Legal status: voluntary.      - Observation status- Q 15 mins     - Meds -   Psychosis  1. Cont risperidone 1mg  po BID    Depression/anxiety  1. Start sertraline 25 mg po nightly    Sleep/nightmares  1. Start melatonin 6 mg po nightly, MRX1     - Medical comorbidities -   None known       - Psychoeducation -      - Risks and benefits of medications discussed and patient verbalized understanding and consented.    - Patient participated in the treatment plan.      - Psychotherapy - encouraged to attend all groups; provide milieu therapy and brief individual therapy as appropriate.     -  Disposition: Patient hopefully can return to staying with 4m    -  LOS: 5-7 days    Doren Custard, NP 03/19/2020, 13:23    I did not see or examine the patient.  I reviewed the Nurse Practitioners note.  I agree with the findings and plan of care as documented in the Nurse Practitioners note.  Any exceptions/additions are edited/noted.    Susy Frizzle, MD  03/19/2020, 18:21

## 2020-03-19 NOTE — Group Note (Signed)
Group topic:  ACTIVITY GROUP    Date of group:  03/19/2020  Start time of group:  1600  End time of group:  1630                 Summary of group discussion: Facilitated recreational activity to promote exercise/physical movement and prosocial behaviors.        Debra Castaneda  is a 30 y.o. female participating in a activity group.    Affect/Mood:  Blunted    Thought Process:  n/a    Thought Content:  n/a    Interpersonal:  n/a    Level of participation:  Full    Comments: Pt was engaged in activity.      [name]  03/19/2020, 16:57

## 2020-03-19 NOTE — Group Note (Addendum)
Psychoeducational Group Note  Date of group:  03/18/2020  Start time of group:  1445  End time of group:  1530               Summary of group discussion: Group centered around familial roles and interpersonal dynamics, and involved psychoeducation on the aforementioned; and guided group discussion about healthy and dysfunctional interpersonal dynamics and expectations. All patients in group were positioned 6 feet apart. All staff wore masks, and all patients were offered them.         Debra Castaneda  s a 30 y.o. female participating in a psychoeducational group.    Affect/Mood:  Blunted    Thought Process:  Unable to assess appropriately     Thought Content: Unable to assess appropriately     Interpersonal:  Isolative    Level of participation:  Partial    Comments: Patient was present but silent throughout group. Pt did not share.     [name] 03/19/2020 09:14

## 2020-03-19 NOTE — Group Note (Signed)
Group topic:  ACTIVITY GROUP    Date of group:  03/19/2020  Start time of group:  1800  End time of group:  1900                 Summary of group discussion: Facilitated recreational activity to promote prosocial behaviors.        Aizza Santiago  is a 30 y.o. female participating in a activity group.    Affect/Mood:  Appropriate    Thought Process:  n/a    Thought Content:  n/a    Interpersonal:  n/a    Level of participation:  Full    Comments: Pt was fully engaged in activity.  Minimal social interactions with peers    [name]  03/19/2020, 19:06

## 2020-03-19 NOTE — Group Note (Signed)
Group topic:  ACTIVITY GROUP    Date of group:  03/19/2020  Start time of group:  1300  End time of group:  1400                 Summary of group discussion: Facilitated recreational craft activity to introduce healthy coping skill and bolster prosocial behavior.       Debra Castaneda  is a 30 y.o. female participating in a activity group.      Comments: Pt was invited but did not attend    [name]  03/19/2020, 14:07

## 2020-03-19 NOTE — Group Note (Signed)
Group topic:  EXPERIENTIAL THERAPY/ACTIVITY GROUP    Date of group:  03/19/2020  Start time of group:  1015  End time of group:  1050               Summary of group discussion: Facilitated experiential therapy group using CBT and person centered strategies.  Group focused on: card game discussion activity called "Empowering Gems". The card game provided questions that allowed patients to challenge themselves and explore the potential to build confidence. The questions were focused on boosting self-esteem and allowing patients to examine their deeper self. The activity allowed for improving your sense of control over your life, augmenting self-awareness and self-assurance, creating a forward-thinking outlook, and facilitating behavioral change. * All patients in group were reminded to follow COVID 19 social distancing guidelines. Staff wore face masks, patients were offered mask and were encouraged to wear facemask during group.           Debra Castaneda  is a 30 y.o. female participating in a experiential/activity group.    Patient's mental status/affect: Flat, Blunted    Patient's behavior: Pt participated in group activity when prompted.    Patient's response: Pt was pleasant in group and participated in discussion questions when prompted. Pt answered the question "If you could go back in time what would you say to yourself" with "to not be born". Pt also stated one of her skills was to "drink wine".     Rally Ouch, SOCIAL WORKER  03/19/2020, 11:10

## 2020-03-19 NOTE — Group Note (Signed)
Group topic:  MUSIC THERAPY GROUP    Date of group:  03/19/2020  Start time of group:  1100  End time of group:  1200      Group Summary: Patients participated in group music therapy session. Patients engaged in improvisational and referential group drumming and drumming conversations to support self-expression and positive social engagement. Patients then engaged in song re-creation and lyric replacement centered on identifying personal strengths and areas for improvement.       Debra Castaneda  is a 30 y.o. female participating in a music group.    Affect/Mood:  Stoic    Thought Process:  not able to assess    Thought Content:  not able to assess    Interpersonal:  Guarded    Level of participation:  Full    Comments: Patient remained in group for entirety of session. Patient appeared to be listening intently. MT notes patient spoke minimally in group. Patient helped clean up instruments and papers at end of session without prompting, and was respectful of others.     Tonye Becket, MMT, MT-BC  03/19/2020, 12:52

## 2020-03-20 NOTE — Group Note (Signed)
Psychoeducational Group Note  Date of group:  03/20/2020  Start time of group:  1100  End time of group:  1200               Summary of group discussion: Psychological group: Emotional regulation.  Group was given handouts on emotional as ways to regulate various emotions and the factors that can make it difficult.  DBT was utilized during group.          Debra Castaneda  s a 30 y.o. female participating in a psychoeducational group.    Affect/Mood:  Appropriate    Thought Process:  Unable to assess    Thought Content:  Unable to assess    Interpersonal:  Attentive and Provided feedback    Level of participation:  Full    Comments:     Fermin Schwab, CT 03/20/2020 12:31

## 2020-03-20 NOTE — Care Plan (Signed)
Problem: Adult Behavioral Health Plan of Care  Goal: Plan of Care Review  Outcome: Ongoing (see interventions/notes)  Flowsheets (Taken 03/20/2020 2246)  Patient Agreement with Plan of Care: agrees  Plan of Care Reviewed With: patient  Progress: improving  Goal: Patient-Specific Goal (Individualization)  Outcome: Ongoing (see interventions/notes)  Goal: Strengths and Vulnerabilities  Outcome: Ongoing (see interventions/notes)  Goal: Adheres to Safety Considerations for Self and Others  Outcome: Ongoing (see interventions/notes)  Intervention: Develop and Maintain Individualized Safety Plan  Flowsheets (Taken 03/20/2020 2246)  Safety Measures:   clinical history reviewed   environmental rounds completed   safety rounds completed  Goal: Absence of New-Onset Illness or Injury  Outcome: Ongoing (see interventions/notes)  Intervention: Identify and Manage Fall Risk  Flowsheets (Taken 03/20/2020 2246)  Safety Measures:   clinical history reviewed   environmental rounds completed   safety rounds completed  Intervention: Prevent VTE (venous thromboembolism)  Flowsheets (Taken 03/20/2020 2246)  VTE Prevention/Management: ambulation promoted  Intervention: Prevent Infection  Flowsheets (Taken 03/20/2020 2246)  Infection Prevention:   promote handwashing   rest/sleep promoted   environmental surveillance performed  Goal: Optimized Coping Skills in Response to Life Stressors  Outcome: Ongoing (see interventions/notes)  Intervention: Promote Effective Coping Strategies  Flowsheets (Taken 03/20/2020 2246)  Supportive Measures:   active listening utilized   journaling promoted   positive reinforcement provided   self-reflection promoted   self-care encouraged   verbalization of feelings encouraged   relaxation techniques promoted  Goal: Develops/Participates in Therapeutic Alliance to Support Successful Transition  Outcome: Ongoing (see interventions/notes)  Intervention: Programmer, systems (Taken 03/20/2020 2246)  Trust  Relationship/Rapport:   care explained   choices provided   reassurance provided   thoughts/feelings acknowledged   emotional support provided   empathic listening provided   questions answered  Goal: Rounds/Family Conference  Outcome: Ongoing (see interventions/notes)  Flowsheets (Taken 03/20/2020 2246)  Participants:   nursing   patient     Problem: Fall Injury Risk  Goal: Absence of Fall and Fall-Related Injury  Outcome: Ongoing (see interventions/notes)  Intervention: Identify and Manage Contributors  Flowsheets (Taken 03/20/2020 2246)  Self-Care Promotion: independence encouraged  Medication Review/Management: medications reviewed  Intervention: Promote Injury-Free Environment  Flowsheets (Taken 03/20/2020 2246)  Safety Promotion/Fall Prevention:   activity supervised   fall prevention program maintained   safety round/check completed   nonskid shoes/slippers when out of bed     Problem: Mood Impairment (Manic or Hypomanic Signs/Symptoms)  Goal: Improved Mood Symptoms (Manic/Hypomanic Signs/Symptoms)  Outcome: Ongoing (see interventions/notes)  Flowsheets (Taken 03/20/2020 2246)  Mutually Determined Action Steps (Improved Mood Symptoms):   acknowledges progress   engages in physical activity   verbalizes increased insight  Intervention: Optimize Emotion and Mood  Flowsheets (Taken 03/20/2020 2246)  Diversional Activity: television  Supportive Measures:   active listening utilized   journaling promoted   positive reinforcement provided   self-reflection promoted   self-care encouraged   verbalization of feelings encouraged   relaxation techniques promoted     Problem: Psychomotor Impairment (Manic or Hypomanic Signs/Symptoms)  Goal: Improved Psychomotor Symptoms (Manic/Hypomanic Signs/Symptoms)  Outcome: Ongoing (see interventions/notes)  Flowsheets (Taken 03/20/2020 2246)  Mutually Determined Action Steps (Improved Psychomotor Symptoms): adheres to medication regimen  Intervention: Manage Psychomotor Movement  Flowsheets  (Taken 03/20/2020 2246)  Activity (Behavioral Health):   up ad lib   activity encouraged  Diversional Activity: television

## 2020-03-20 NOTE — Care Plan (Signed)
Problem: Mood Impairment (Manic or Hypomanic Signs/Symptoms)  Goal: Improved Mood Symptoms (Manic/Hypomanic Signs/Symptoms)  Flowsheets (Taken 03/20/2020 0030)  Mutually Determined Action Steps (Improved Mood Symptoms):   acknowledges progress   engages in physical activity  Intervention: Optimize Emotion and Mood  Flowsheets (Taken 03/20/2020 0030)  Diversional Activity: reading  Supportive Measures:   active listening utilized   journaling promoted   self-reflection promoted   relaxation techniques promoted   positive reinforcement provided   verbalization of feelings encouraged     Problem: Psychomotor Impairment (Manic or Hypomanic Signs/Symptoms)  Goal: Improved Psychomotor Symptoms (Manic/Hypomanic Signs/Symptoms)  Flowsheets (Taken 03/20/2020 0030)  Mutually Determined Action Steps (Improved Psychomotor Symptoms): adheres to medication regimen  Intervention: Manage Psychomotor Movement  Flowsheets (Taken 03/20/2020 0030)  Activity (Behavioral Health):   activity encouraged   up ad lib  Diversional Activity: reading

## 2020-03-20 NOTE — Nurses Notes (Signed)
Pt up on the unit for meals, medications and is participating in groups. Pt reported feeling "bored". Pt has broad affect and appropriate eye-contact. Pt spoke about rigid religious experience as a child. Pt is quiet, polite, and engages well. Denies suicidal ideations. Denies hallucinations. Provided pt with therapeutic communication. Will continue to monitor for safety.

## 2020-03-20 NOTE — Group Note (Signed)
Psychoeducational Group Note  Date of group:  03/20/2020  Start time of group:  1430  End time of group:  1520               Summary of group discussion: Psychoeducational group on coping skills and self-care.  Patient were given Mand[place] to color and group members discussed various coping skills they like to engage in.  Writer discussed the importance of self-care with the group members.  CBT and client centered therapy were utilized.         Debra Castaneda  s a 30 y.o. female participating in a psychoeducational group.    Pt was invited to group, but did not attend.        Fermin Schwab, CT 03/20/2020 15:31

## 2020-03-20 NOTE — Nurses Notes (Signed)
Patient is up and about on the unit, trying to stay busy. She says "It's really boring here." She does word searches, watches tv, walks and reads when she can focus. She still feels confused at times but it seems to be getting better. Medication compliant tonight. Educated on new meds and verbalized understanding of possible side effects and when to alert nurse. She only needed one dose of melatonin 6 mg for sleep. Denies SI/HI/AVH. Pleasant and cooperative but quiet. Flat affect. Eye contact hesitant and often speaks looking down. Fifteen minute checks continue.

## 2020-03-20 NOTE — Progress Notes (Signed)
[facility]  Psychiatric Inpatient Progress Note  Name: Debra Castaneda  MRN: [id number]  DOB: 1990/07/26  Date of Service: 03/20/2020    CC: depression    Interval Hx:  Zuleika Gallus is a 30 y.o. female with h/o confusion, psychosis, possible thought blocking, chronic PTSD. Patient was evaluated and discussed with the multidisciplinary team. Pt reports feeling better today, feeling the meds are better for her mood. Pt denies concern with sleep/appetite/ SI/ HI/ AVH.     Review of System:  Pain-patient rates pain today at 0 on a 10 point scale (10= worst)  Constitutional-no evidence of fever, congestion, or other complaints  Eyes-no evidence of visual problems  ENT-no evidence of hearing deficit, tinnitus, sore throat  Cardiovascular-no evidence of chest pain, palpitations, or edema  Respiratory-no evidence of cough or dyspnea  GI-no evidence of nausea, vomiting, or diarrhea  GU-no evidence of dysuria or increased frequency  Musculoskeletal-no evidence of myalgias or weakness  Skin-no evidence of rash or excoriation  Neuro-no evidence of headaches, dizziness or tremor, no stiffness, no difficulties with movement, swallowing food, no drooling    Physical Exam:Gait- Normal; no evidence of changes in gait/ambulation; no assistive devices used    Filed Vitals:    03/18/20 1735 03/19/20 0631 03/19/20 1700 03/20/20 0641   BP: 113/71 107/63 116/76 107/70   Pulse: 78 79 84 75   Resp: 14 16 14 16    Temp: 37.1 C (98.7 F) 36.3 C (97.3 F) 36.5 C (97.7 F) 36.7 C (98.1 F)   SpO2: 100% 100% 100% 97%       Labs:   Lab Results   Component Value Date    HA1C 5.4 03/17/2020       CBC  Diff   Lab Results   Component Value Date/Time    WBC 7.1 03/15/2020 02:09 PM    HGB 14.3 03/15/2020 02:09 PM    HCT 43.1 03/15/2020 02:09 PM    PLTCNT 288 03/15/2020 02:09 PM    RBC 4.59 03/15/2020 02:09 PM    MCV 93.9 03/15/2020 02:09 PM    MCHC 33.2 03/15/2020 02:09 PM    MCH 31.2 03/15/2020 02:09 PM    MPV 11.2 03/15/2020  02:09 PM    Lab Results   Component Value Date/Time    PMNS 67 03/15/2020 02:09 PM    MONOCYTES 6 03/15/2020 02:09 PM    BASOPHILS 1 03/15/2020 02:09 PM    BASOPHILS <0.10 03/15/2020 02:09 PM    PMNABS 4.77 03/15/2020 02:09 PM    LYMPHSABS 1.78 03/15/2020 02:09 PM    EOSABS <0.10 03/15/2020 02:09 PM    MONOSABS 0.41 03/15/2020 02:09 PM          Comprehensive Metabolic Profile    Lab Results   Component Value Date/Time    SODIUM 140 03/15/2020 02:09 PM    POTASSIUM 4.7 03/15/2020 02:09 PM    CHLORIDE 104 03/15/2020 02:09 PM    CO2 26 03/15/2020 02:09 PM    ANIONGAP 10 03/15/2020 02:09 PM    BUN 14 03/15/2020 02:09 PM    CREATININE 0.77 03/15/2020 02:09 PM    GLUCOSE Negative 03/15/2020 02:10 PM    Lab Results   Component Value Date/Time    CALCIUM 9.8 03/15/2020 02:09 PM    ALBUMIN 4.6 03/15/2020 02:09 PM    TOTALPROTEIN 7.9 03/15/2020 02:09 PM    ALKPHOS 58 03/15/2020 02:09 PM    AST 39 03/15/2020 02:09 PM    ALT 33 (H) 03/15/2020 02:09  PM    TOTBILIRUBIN 0.6 03/15/2020 02:09 PM            Hepatic Function    Lab Results   Component Value Date/Time    ALBUMIN 4.6 03/15/2020 02:09 PM    TOTALPROTEIN 7.9 03/15/2020 02:09 PM    ALKPHOS 58 03/15/2020 02:09 PM    Lab Results   Component Value Date/Time    AST 39 03/15/2020 02:09 PM    ALT 33 (H) 03/15/2020 02:09 PM            Lab Results   Component Value Date    CHOLESTEROL 151 03/17/2020    HDLCHOL 49 (L) 03/17/2020    LDLCHOL 84 03/17/2020    TRIG 92 03/17/2020      Lab Results   Component Value Date    COVID19PCR Detected (A) 03/15/2020     COVID-19  No fever  No cough  No SOB  No headache  No loss of taste or smell  No recent travel      Medications:  acetaminophen (TYLENOL) tablet, 650 mg, Oral, Q4H PRN  aluminum-magnesium hydroxide-simethicone (MAG-AL PLUS) 200-200-20 mg per 5 mL oral liquid, 30 mL, Oral, Q4H PRN  diphenhydrAMINE (BENADRYL) capsule, 50 mg, Oral, Q6H PRN   Or  diphenhydrAMINE (BENADRYL) 50 mg/mL injection, 50 mg, IntraMUSCULAR, Q6H PRN  haloperidol  (HALDOL) tablet, 5 mg, Oral, Q8H PRN   Or  haloperidol (HALDOL) 5 mg/mL injection, 5 mg, IntraMUSCULAR, Q8H PRN  hydrOXYzine (ATARAX) tablet, 25 mg, Oral, Q8H PRN  LORazepam (ATIVAN) tablet, 2 mg, Oral, Q6H PRN   Or  LORazepam (ATIVAN) 2 mg/mL injection, 2 mg, IntraMUSCULAR, Q6H PRN  magnesium hydroxide (MILK OF MAGNESIA) 400mg  per 39mL oral liquid, 30 mL, Oral, HS PRN  melatonin tablet, 6 mg, Oral, HS - MR x 1  mirtazapine (REMERON) tablet, 7.5 mg, Oral, HS PRN - MR x 1  nicotine polacrilex (NICORETTE) chewing gum, 2 mg, Oral, Q1H PRN  risperiDONE (RISPERDAL) tablet, 1 mg, Oral, 2x/day  sertraline (ZOLOFT) tablet, 25 mg, Oral, NIGHTLY          ALLERGIES:  Allergies   Allergen Reactions   . Lactose  Other Adverse Reaction (Add comment)     Upset stomach; diarrhea   . Penicillins    . Pork/Porcine Containing Products      Upset stomach         PROBLEM LIST CONSIDERED IN MEDICAL DESCION MAKING AND COMPLEXITY:  Patient Active Problem List    Diagnosis    . Unspecified mood (affective) disorder (CMS HCC)          MENTAL STATUS EXAMINATION:  Patient is dressed appropriately, appears stated age.  Eye contact and rapport good today. Grooming and hygiene-Good  Patient is cooperative, pleasant during the interview.  No involuntary movements.  Speech- Soft spoken, normal rate and tone.  Reaction time-normal.  Mood: Severe depression and anxiety  Affect:  Blunted  Thought process: Less confused, not disorganized.   Thought content:  Denies suicidal or homicidal thoughts, plans or intent.  Denies paranoid thoughts, delusions, obsessions, thought insertion, withdrawal or broadcasting.  Perception:  Denies hallucinations but I observed her reacting in such a way as to point to both auditory and visual hallucinations.  Patient is observed responding to internal stimuli.  Cognition:  Alert, oriented x3, attention and concentration-better sustained Memory-immediate, recent, remote-intact other than post ECT 3  months  Insight-fair  Judgment-fair    Diagnoses:  MDD rec sev with +SI, Confusion and A/V  hallucinations, SI resolved,  Psychosis resolving  Chronic PTSD     Plan:     - Legal status: voluntary.      - Observation status- Q 15 mins     - Meds -   Psychosis  1. Cont risperidone 1mg  po BID    Depression/anxiety  1. Start sertraline 25 mg po nightly    Sleep/nightmares  1. Start melatonin 6 mg po nightly, MRX1     - Medical comorbidities -   None known       - Psychoeducation -      - Risks and benefits of medications discussed and patient verbalized understanding and consented.    - Patient participated in the treatment plan.      - Psychotherapy - encouraged to attend all groups; provide milieu therapy and brief individual therapy as appropriate.     -  Disposition: Patient hopefully can return to staying with    - LOS: 5-7 days    Jearld Shines, MD  03/20/2020, 09:48

## 2020-03-21 NOTE — Nurses Notes (Signed)
Patient is alert/oriented x 4, able to verbalize her needs to staff, attended wrap up group and evening snack.  Patient interacts with staff and peers appropriately,  well groomed wearing personal clothing.  Patient is calm, cooperative with care and assessments.     Mood: "Bored"    Affect: smiling,   Speech: clear, coherent  Denies SI/HI, A/VH and delusions  Pain denies  Denies GI/GU symptoms, n/v/d   Sleep: denies sleep disturbances  Reviewed medications, patient is knowledgeable regarding medications, compliant with medication regime, denies adverse side effects at this time.  Patient encouraged to come to staff with questions/concerns.  Will continue to monitor every 15 minutes for patient safety.     Patient receptive to masking during shift while in care areas on the unit (Y/N)? No    Patient receptive to social distancing procedures on unit? (Y/N) No    Any observed variances from baseline not r/t detox (Y/N). If yes, specify: No    Febrile:  Gastrointestinal:  Respiratory:  Fatigue:  Headache/Lightheadedness:  Chills/muscle ache:  Sore throat:  New onset confusion:    Filed Vitals:    03/19/20 0631 03/19/20 1700 03/20/20 0641 03/20/20 1800   BP: 107/63 116/76 107/70 109/63   Pulse: 79 84 75 74   Resp: 16 14 16 18    Temp: 36.3 C (97.3 F) 36.5 C (97.7 F) 36.7 C (98.1 F) 37.1 C (98.8 F)   SpO2: 100% 100% 97% 99%

## 2020-03-21 NOTE — Group Note (Signed)
Psychoeducational Group Note  Date of group:  03/21/2020  Start time of group:  1430  End time of group:  1525               Summary of group discussion: Per the request of the group, group focused on self care and communicating needs to one's support network, and involved psychoeducation on the topic, guided group discussion, and an activity in which patients were provided with a checklist of the needs they may experience and were aided in developing positive strategies to communicate these needs. All patients in group were positioned 6 feet apart. All staff wore masks, and all patients were offered them.         Debra Castaneda  s a 30 y.o. female participating in a psychoeducational group.    Affect/Mood:  Blunted    Thought Process:  Logical    Thought Content:  Within normal limits    Interpersonal:  Attentive    Level of participation:  Partial; pt present for first half of group     Comments: Patient engaged when prompted in the group, and indicated the ability to identify a self care strategy they have already utilized today: coming to group     [name] 03/21/2020 15:48

## 2020-03-21 NOTE — Progress Notes (Signed)
[facility]  Psychiatric Inpatient Progress Note  Name: Debra Castaneda  MRN: [id number]  DOB: 11-30-1990  Date of Service: 03/21/2020    CC: depression    Interval Hx:  Debra Castaneda is a 30 y.o. female with h/o confusion, psychosis, possible thought blocking, chronic PTSD. Patient was evaluated and discussed with the multidisciplinary team. Pt reports feeling better today, feeling the meds are better for her mood. Pt denies concern with sleep/appetite/ SI/ HI/ AVH. No change from yesterday.    Review of System:  Pain-patient rates pain today at 0 on a 10 point scale (10= worst)  Constitutional-no evidence of fever, congestion, or other complaints  Eyes-no evidence of visual problems  ENT-no evidence of hearing deficit, tinnitus, sore throat  Cardiovascular-no evidence of chest pain, palpitations, or edema  Respiratory-no evidence of cough or dyspnea  GI-no evidence of nausea, vomiting, or diarrhea  GU-no evidence of dysuria or increased frequency  Musculoskeletal-no evidence of myalgias or weakness  Skin-no evidence of rash or excoriation  Neuro-no evidence of headaches, dizziness or tremor, no stiffness, no difficulties with movement, swallowing food, no drooling    Physical Exam:Gait- Normal; no evidence of changes in gait/ambulation; no assistive devices used    Filed Vitals:    03/19/20 1700 03/20/20 0641 03/20/20 1800 03/21/20 0642   BP: 116/76 107/70 109/63 100/63   Pulse: 84 75 74 (!) 106   Resp: 14 16 18 16    Temp: 36.5 C (97.7 F) 36.7 C (98.1 F) 37.1 C (98.8 F) 36.5 C (97.7 F)   SpO2: 100% 97% 99% 97%       Labs:   Lab Results   Component Value Date    HA1C 5.4 03/17/2020       CBC  Diff   Lab Results   Component Value Date/Time    WBC 7.1 03/15/2020 02:09 PM    HGB 14.3 03/15/2020 02:09 PM    HCT 43.1 03/15/2020 02:09 PM    PLTCNT 288 03/15/2020 02:09 PM    RBC 4.59 03/15/2020 02:09 PM    MCV 93.9 03/15/2020 02:09 PM    MCHC 33.2 03/15/2020 02:09 PM    MCH 31.2 03/15/2020  02:09 PM    MPV 11.2 03/15/2020 02:09 PM    Lab Results   Component Value Date/Time    PMNS 67 03/15/2020 02:09 PM    MONOCYTES 6 03/15/2020 02:09 PM    BASOPHILS 1 03/15/2020 02:09 PM    BASOPHILS <0.10 03/15/2020 02:09 PM    PMNABS 4.77 03/15/2020 02:09 PM    LYMPHSABS 1.78 03/15/2020 02:09 PM    EOSABS <0.10 03/15/2020 02:09 PM    MONOSABS 0.41 03/15/2020 02:09 PM          Comprehensive Metabolic Profile    Lab Results   Component Value Date/Time    SODIUM 140 03/15/2020 02:09 PM    POTASSIUM 4.7 03/15/2020 02:09 PM    CHLORIDE 104 03/15/2020 02:09 PM    CO2 26 03/15/2020 02:09 PM    ANIONGAP 10 03/15/2020 02:09 PM    BUN 14 03/15/2020 02:09 PM    CREATININE 0.77 03/15/2020 02:09 PM    GLUCOSE Negative 03/15/2020 02:10 PM    Lab Results   Component Value Date/Time    CALCIUM 9.8 03/15/2020 02:09 PM    ALBUMIN 4.6 03/15/2020 02:09 PM    TOTALPROTEIN 7.9 03/15/2020 02:09 PM    ALKPHOS 58 03/15/2020 02:09 PM    AST 39 03/15/2020 02:09 PM    ALT  33 (H) 03/15/2020 02:09 PM    TOTBILIRUBIN 0.6 03/15/2020 02:09 PM            Hepatic Function    Lab Results   Component Value Date/Time    ALBUMIN 4.6 03/15/2020 02:09 PM    TOTALPROTEIN 7.9 03/15/2020 02:09 PM    ALKPHOS 58 03/15/2020 02:09 PM    Lab Results   Component Value Date/Time    AST 39 03/15/2020 02:09 PM    ALT 33 (H) 03/15/2020 02:09 PM            Lab Results   Component Value Date    CHOLESTEROL 151 03/17/2020    HDLCHOL 49 (L) 03/17/2020    LDLCHOL 84 03/17/2020    TRIG 92 03/17/2020      Lab Results   Component Value Date    COVID19PCR Detected (A) 03/15/2020     COVID-19  No fever  No cough  No SOB  No headache  No loss of taste or smell  No recent travel      Medications:  acetaminophen (TYLENOL) tablet, 650 mg, Oral, Q4H PRN  aluminum-magnesium hydroxide-simethicone (MAG-AL PLUS) 200-200-20 mg per 5 mL oral liquid, 30 mL, Oral, Q4H PRN  diphenhydrAMINE (BENADRYL) capsule, 50 mg, Oral, Q6H PRN   Or  diphenhydrAMINE (BENADRYL) 50 mg/mL injection, 50 mg,  IntraMUSCULAR, Q6H PRN  haloperidol (HALDOL) tablet, 5 mg, Oral, Q8H PRN   Or  haloperidol (HALDOL) 5 mg/mL injection, 5 mg, IntraMUSCULAR, Q8H PRN  hydrOXYzine (ATARAX) tablet, 25 mg, Oral, Q8H PRN  LORazepam (ATIVAN) tablet, 2 mg, Oral, Q6H PRN   Or  LORazepam (ATIVAN) 2 mg/mL injection, 2 mg, IntraMUSCULAR, Q6H PRN  magnesium hydroxide (MILK OF MAGNESIA) 400mg  per 51mL oral liquid, 30 mL, Oral, HS PRN  melatonin tablet, 6 mg, Oral, HS - MR x 1  mirtazapine (REMERON) tablet, 7.5 mg, Oral, HS PRN - MR x 1  nicotine polacrilex (NICORETTE) chewing gum, 2 mg, Oral, Q1H PRN  risperiDONE (RISPERDAL) tablet, 1 mg, Oral, 2x/day  sertraline (ZOLOFT) tablet, 25 mg, Oral, NIGHTLY          ALLERGIES:  Allergies   Allergen Reactions    Lactose  Other Adverse Reaction (Add comment)     Upset stomach; diarrhea    Penicillins     Pork/Porcine Containing Products      Upset stomach         PROBLEM LIST CONSIDERED IN MEDICAL DESCION MAKING AND COMPLEXITY:  Patient Active Problem List    Diagnosis     Unspecified mood (affective) disorder (CMS HCC)          MENTAL STATUS EXAMINATION:  Patient is dressed appropriately, appears stated age.  Eye contact and rapport good today. Grooming and hygiene-Good  Patient is cooperative, pleasant during the interview.  No involuntary movements.  Speech- Soft spoken, normal rate and tone.  Reaction time-normal.  Mood: Severe depression and anxiety  Affect:  Blunted  Thought process: Less confused, not disorganized.   Thought content:  Denies suicidal or homicidal thoughts, plans or intent.  Denies paranoid thoughts, delusions, obsessions, thought insertion, withdrawal or broadcasting.  Perception:  Denies hallucinations but I observed her reacting in such a way as to point to both auditory and visual hallucinations.  Patient is observed responding to internal stimuli.  Cognition:  Alert, oriented x3, attention and concentration-better sustained Memory-immediate, recent, remote-intact other than  post ECT 3 months  Insight-fair  Judgment-fair    Diagnoses:  MDD rec sev with +  SI, Confusion and A/V hallucinations, SI resolved,  Psychosis resolving  Chronic PTSD     Plan:     - Legal status: voluntary.      - Observation status- Q 15 mins     - Meds -   Psychosis  1. Cont risperidone 1mg  po BID    Depression/anxiety  1. Start sertraline 25 mg po nightly    Sleep/nightmares  1. Start melatonin 6 mg po nightly, MRX1     - Medical comorbidities -   None known       - Psychoeducation -      - Risks and benefits of medications discussed and patient verbalized understanding and consented.    - Patient participated in the treatment plan.      - Psychotherapy - encouraged to attend all groups; provide milieu therapy and brief individual therapy as appropriate.     -  Disposition: Patient hopefully can return to staying with    - LOS: 5-7 days    Jearld Shines, MD  03/21/2020, 10:47

## 2020-03-21 NOTE — Behavioral Health (Signed)
Spoke to patient about discharge planning; as pt does not qualify for medicaid at this time, pt requesting to be set up with a PCP for follow up, as it will have a lower cost. Will complete this on Monday.

## 2020-03-21 NOTE — Care Plan (Signed)
Problem: Adult Behavioral Health Plan of Care  Goal: Plan of Care Review  Outcome: Ongoing (see interventions/notes)  Goal: Patient-Specific Goal (Individualization)  Outcome: Ongoing (see interventions/notes)  Goal: Strengths and Vulnerabilities  Outcome: Ongoing (see interventions/notes)  Goal: Adheres to Safety Considerations for Self and Others  Outcome: Ongoing (see interventions/notes)  Goal: Absence of New-Onset Illness or Injury  Outcome: Ongoing (see interventions/notes)  Goal: Optimized Coping Skills in Response to Life Stressors  Outcome: Ongoing (see interventions/notes)  Goal: Develops/Participates in Therapeutic Alliance to Support Successful Transition  Outcome: Ongoing (see interventions/notes)  Goal: Rounds/Family Conference  Outcome: Ongoing (see interventions/notes)     Problem: Fall Injury Risk  Goal: Absence of Fall and Fall-Related Injury  Outcome: Ongoing (see interventions/notes)     Problem: Mood Impairment (Manic or Hypomanic Signs/Symptoms)  Goal: Improved Mood Symptoms (Manic/Hypomanic Signs/Symptoms)  Outcome: Ongoing (see interventions/notes)     Problem: Psychomotor Impairment (Manic or Hypomanic Signs/Symptoms)  Goal: Improved Psychomotor Symptoms (Manic/Hypomanic Signs/Symptoms)  Outcome: Ongoing (see interventions/notes)

## 2020-03-21 NOTE — Group Note (Signed)
Psychoeducational Group Note  Date of group:  03/21/2020  Start time of group:  1100  End time of group:  1155               Summary of group discussion: Group centered around the theme of Imagine It, Plan It, Do It and involved psychoeducation on SMART goal setting and resource utilization; and an activity in which patients were allowed to workshop goals they would like to set for themselves in regards to behavioral changes they would like to set for themselves. All patients in group were positioned 6 feet apart. All staff wore masks, and all patients were offered them.         Debra Castaneda  s a 30 y.o. female participating in a psychoeducational group.    Affect/Mood:  Blunted    Thought Process:  Concrete    Thought Content:  Within normal limits    Interpersonal:  Isolative    Level of participation:  Full    Comments: Patiently actively engaged in group as prompted. Pt worked on developing a plan to meet their goal of being discharged.     [name] 03/21/2020 13:00

## 2020-03-21 NOTE — Nurses Notes (Signed)
Patient is alert, oriented, calm, and cooperative. Patient denies any homicidal or suicidal ideations. Patient denies any auditory or visual hallucinations. Patient is out on unit interacting with staff and peers. Patient is attending and participating in groups. Patient had a goal today to "attend all classes". Patient has good PO intake eating 100% of meals. Patient is medication compliant and has not been a management issue.

## 2020-03-21 NOTE — Group Note (Signed)
Group topic:  ACTIVITY GROUP    Date of group:  03/21/2020  Start time of group:  1300  End time of group:  1400                 Summary of group discussion: Facilitated recreational craft activity to introduce healthy coping skill and bolster prosocial behavior.       Beverly Ferner  is a 30 y.o. female participating in a activity group.    Affect/Mood:  Appropriate    Thought Process:  n/a    Thought Content:  n/a    Interpersonal:  n/a    Level of participation:  Full    Comments: none    [name]  03/21/2020, 14:05

## 2020-03-21 NOTE — Group Note (Signed)
Group topic:  ACTIVITY GROUP    Date of group:  03/21/2020  Start time of group:  1600  End time of group:  1650                 Summary of group discussion: Facilitated recreational activity to promote exercise/physical movement and prosocial behaviors.        Markeshia Giebel  is a 30 y.o. female participating in a activity group.    Affect/Mood:  Appropriate    Thought Process:  n/a    Thought Content:  n/a    Interpersonal:  n/a    Level of participation:  Full    Comments: none    [name]  03/21/2020, 16:54

## 2020-03-21 NOTE — Group Note (Signed)
Group topic:  ACTIVITY GROUP    Date of group:  03/21/2020  Start time of group:  1800  End time of group:  1900                 Summary of group discussion: Facilitated recreational activity to promote prosocial behaviors.        Debra Castaneda  is a 30 y.o. female participating in a activity group.    Affect/Mood:  Appropriate    Thought Process:  n/a    Thought Content:  n/a    Interpersonal:  n/a    Level of participation:  Full    Comments: none    [name]  03/21/2020, 19:01

## 2020-03-22 MED ORDER — MELATONIN 3 MG TABLET
6.0000 mg | ORAL_TABLET | Freq: Every evening | ORAL | 0 refills | Status: AC
Start: 2020-03-22 — End: 2020-04-21

## 2020-03-22 MED ORDER — SERTRALINE 25 MG TABLET
25.0000 mg | ORAL_TABLET | Freq: Every evening | ORAL | 0 refills | Status: DC
Start: 2020-03-22 — End: 2020-04-06

## 2020-03-22 MED ORDER — RISPERIDONE 1 MG TABLET
1.0000 mg | ORAL_TABLET | Freq: Two times a day (BID) | ORAL | 0 refills | Status: AC
Start: 2020-03-22 — End: 2020-04-21

## 2020-03-22 NOTE — Nurses Notes (Signed)
Patient discharged on 03/22/2020 at 1500 with girlfriend. Discharge instructions given to patient instructed to contact physician with any questions/concerns.  Belongings returned to patient. After Visit Summary-Discharge instructions faxed to Dr. Janee Morn on 03/22/20 at 1220.

## 2020-03-22 NOTE — Care Plan (Signed)
Problem: Adult Behavioral Health Plan of Care  Goal: Plan of Care Review  Outcome: Ongoing (see interventions/notes)  Flowsheets (Taken 03/22/2020 1009)  Patient Agreement with Plan of Care: agrees  Plan of Care Reviewed With: patient  Progress: improving  Goal: Patient-Specific Goal (Individualization)  Outcome: Ongoing (see interventions/notes)  Goal: Strengths and Vulnerabilities  Outcome: Ongoing (see interventions/notes)  Goal: Adheres to Safety Considerations for Self and Others  Outcome: Ongoing (see interventions/notes)  Intervention: Develop and Maintain Individualized Safety Plan  Flowsheets (Taken 03/22/2020 1009)  Safety Measures:   safety rounds completed   suicide assessment completed   environmental rounds completed  Goal: Absence of New-Onset Illness or Injury  Outcome: Ongoing (see interventions/notes)  Intervention: Identify and Manage Fall Risk  Flowsheets (Taken 03/22/2020 1009)  Safety Measures:   safety rounds completed   suicide assessment completed   environmental rounds completed  Intervention: Prevent VTE (venous thromboembolism)  Flowsheets (Taken 03/22/2020 1009)  VTE Prevention/Management: ambulation promoted  Intervention: Prevent Infection  Flowsheets (Taken 03/22/2020 1009)  Infection Prevention:   rest/sleep promoted   promote handwashing   equipment surfaces disinfected  Goal: Optimized Coping Skills in Response to Life Stressors  Outcome: Ongoing (see interventions/notes)  Intervention: Promote Effective Coping Strategies  Flowsheets (Taken 03/22/2020 1009)  Supportive Measures:   active listening utilized   self-reflection promoted   self-responsibility promoted   verbalization of feelings encouraged   self-care encouraged  Goal: Develops/Participates in Therapeutic Alliance to Support Successful Transition  Outcome: Ongoing (see interventions/notes)  Intervention: Programmer, systems (Taken 03/22/2020 1009)  Trust Relationship/Rapport:   care explained   questions  answered   questions encouraged   choices provided   emotional support provided   reassurance provided   thoughts/feelings acknowledged   empathic listening provided  Goal: Rounds/Family Conference  Outcome: Ongoing (see interventions/notes)  Flowsheets (Taken 03/22/2020 1009)  Participants:   patient   nursing     Problem: Fall Injury Risk  Goal: Absence of Fall and Fall-Related Injury  Outcome: Ongoing (see interventions/notes)  Intervention: Promote Merchandiser, retail (Taken 03/22/2020 1009)  Safety Promotion/Fall Prevention:   activity supervised   nonskid shoes/slippers when out of bed   safety round/check completed     Problem: Mood Impairment (Manic or Hypomanic Signs/Symptoms)  Goal: Improved Mood Symptoms (Manic/Hypomanic Signs/Symptoms)  Outcome: Ongoing (see interventions/notes)  Intervention: Optimize Emotion and Mood  Flowsheets (Taken 03/22/2020 1009)  Supportive Measures:   active listening utilized   self-reflection promoted   self-responsibility promoted   verbalization of feelings encouraged   self-care encouraged     Problem: Psychomotor Impairment (Manic or Hypomanic Signs/Symptoms)  Goal: Improved Psychomotor Symptoms (Manic/Hypomanic Signs/Symptoms)  Outcome: Ongoing (see interventions/notes)  Intervention: Manage Psychomotor Movement  Flowsheets (Taken 03/22/2020 1009)  Activity (Behavioral Health): up ad lib

## 2020-03-22 NOTE — Nurses Notes (Signed)
Assumed pt care. She is blunted and calm. She is out in milieu. She is dressed in personal clothes. She is well kept. She is med compliant. Her mood is "good." She maintained appropiate eye contact. She reports sleep is "good." She denies any hallucination, suicidal thoughts, or homicidal thoughts. She ate 100% of breakfast. She has not requested PRN medications. No pain reported at this time. She was encouraged to seek staff regarding any questions or concerns. Staff will continue to round q15.       Patient receptive to masking during shift while in care areas on the unit (Y/N)? No    Patient receptive to social distancing procedures on unit? (Y/N) Yes    Any observed variances from baseline not r/t detox (Y/N). If yes, specify: No    Febrile:  Gastrointestinal:  Respiratory:  Fatigue:  Headache/Lightheadedness:  Chills/muscle ache:  Sore throat:  New onset confusion:    Filed Vitals:    03/20/20 1800 03/21/20 0642 03/21/20 1700 03/22/20 0659   BP: 109/63 100/63 116/64 99/61   Pulse: 74 (!) 106 66 53   Resp: 18 16 14 16    Temp: 37.1 C (98.8 F) 36.5 C (97.7 F) 36.7 C (98.1 F) 36.4 C (97.5 F)   SpO2: 99% 97% 100% 97%

## 2020-03-22 NOTE — Group Note (Signed)
Group topic:  ACTIVITY GROUP    Date of group:  03/22/2020  Start time of group:  1300  End time of group:  1400                 Summary of group discussion: Facilitated recreational craft activity to introduce healthy coping skill and bolster prosocial behavior.       Debra Castaneda  is a 30 y.o. female participating in a activity group.    Affect/Mood:  Appropriate    Thought Process:  n/a    Thought Content:  n/a    Interpersonal:  n/a    Level of participation:  Full    Comments: Pt was fully engaged in activity    [name]  03/22/2020, 14:38

## 2020-03-22 NOTE — Behavioral Health (Signed)
Per provider Doren Custard, patient is being discharged today. Spoke to pt about discharge needs.    Pt will be going home   Pt will be transported by [employer] will be here at 15:00  Nursing informed.   Reviewed follow up plan with patient, and patient vocalizes understanding   Reviewed applying for [facility], and provided patient with application, per request.   Patient provided 2 work notes, as requested.   Pt denies other needs at this time.

## 2020-03-22 NOTE — Discharge Summary (Signed)
[facility]  DISCHARGE SUMMARY    PATIENT NAME:  Debra Castaneda  MRN:  [id number]  DOB:  [birth year]    ADMISSION DATE:  03/16/2020  DISCHARGE DATE:   03/22/2020     ATTENDING PHYSICIAN: Maximino Sarin, MD  PRIMARY CARE PHYSICIAN: No Pcp     ADMISSION DIAGNOSIS:  Unspecified mood (affective) disorder (CMS HCC) [F39]     DISCHARGE DIAGNOSIS:   MDD rec sev with +SI, Confusion and A/V hallucinations, SI resolved, Psychosis resolving  Chronic PTSD      Present on Admission:  . Unspecified mood (affective) disorder (CMS HCC)                                                                           REASON FOR HOSPITALIZATION:   Per H&P: "The patient is very reluctant to provide information. Her friend relates that the patient has a history of Bipolar I disorder and had been on lithium in the past however has not been on medical treatment in over a year. She states that in the last week approximately the patient has been having mood swings, inability to sleep, depression and thoughts of ending her life. She denies that she has a plan to do so. She notes that the pateint had been hospitalized in the past in [place]."    Per Crisis Worker Nannette Redmon dated 03/15/2020: "Presenting problem:Pt's girlfriend states she brought pt to ED stating pt has been having mood swings, not sleeping, not eating stating she is confused. When Crisis attempted to talk to pt she repeatedly stated she is confused and "they are on top yelling louder and louder". Pt informed Crisis she dont want to feel this way. She is holding on to chop sticks with lights in them clicking them. She is very quiet and kept her head down."    Substance abuse- cigarette-non-smoker, alcohol- unknown at this time, drugs-unknown at this time: urine drug screen negative."    HOSPITAL COURSE:    Upon admission, with regard to legal status, the patient was originally admitted on a voluntary basis.  Patient's CBC, BMP, TSH,  urinanalysis, ethanol level were within normal limits. Lab results if performed for lipid panel, liver function, and hemoglobin A1C are listed below. Patient was determined to be medically stable prior to transfer to the inpatient unit. Patient was initially placed on Q 15 minutes checks which was adjusted according to the patient's behavior. Patient was given the opportunity to talk to staff and the treatment team.  Patient was encouraged to attend groups and did participate in milieu therapy.  Patient successfully attended 100% of the groups.      Psychopharmacologic management and response:  Pt was started on Risperidone 1mg  po twice a day for mood instability and sertraline 25 mg po nightly for depression and anxiety.  Pt, in addition, was given melatonin 6 mg for poor sleep. Patient tolerated the medications without side effects and advised to benefit from continued use.     Patient endorsed an improvement in mood and sleep.      At the time of discharge, patient verbalized understanding of all medication changes and discharge instructions. Patient denied suicidal or homicidal thoughts, or psychotic  symptoms.  Patient felt safe to go home and was instructed to call or return to ED if safety concerns arose. Patient was discharged home in stable medical and mental health condition.     Mental status examination at the time of discharge:    Patient is dressed appropriately, appears stated age.  Eye contact and rapport established. Grooming and hygiene-Good  Patient is cooperative, pleasant during the interview and is not in any apparent distress.  No involuntary movements.  Speech-normal volume, rate, tone.  Reaction time-normal  Mood: "good"  Affect:  Full range, congruent and stable.  Thought process:  Linear, logical and goal directed.  Thought content:  Denies suicidal or homicidal thoughts, plans or intent.  Denies paranoid thoughts, delusions, obsessions, thought insertion, withdrawal or  broadcasting.  Perception:  Denies A/V/T/G/O hallucinations.  Patient is not responding to internal stimuli.  Cognition:  Alert, oriented x4, attention and concentration-well sustained.  Memory-immediate, recent, remote-intact  Insight-present  Judgment-intact    - Lab works, Medical issues: per chart    Metabolic Screening/LAB TESTING:  BP Readings from Last 1 Encounters:   03/22/20 99/61     Wt Readings from Last 1 Encounters:   03/17/20 60.3 kg (133 lb)     Ht Readings from Last 1 Encounters:   03/16/20 1.6 m (5\' 3" )     Body mass index is 23.56 kg/m.  Lab Results   Component Value Date    HA1C 5.4 03/17/2020     No results found for: GLUCOSEFAST  Lab Results   Component Value Date    CHOLESTEROL 151 03/17/2020    LDLCHOL 84 03/17/2020    TRIG 92 03/17/2020     Lab Results   Component Value Date    TRIG 92 03/17/2020    HDLCHOL 49 (L) 03/17/2020    LDLCHOL 84 03/17/2020    CHOLESTEROL 151 03/17/2020       Hepatic Function    Lab Results   Component Value Date/Time    ALBUMIN 4.6 03/15/2020 02:09 PM    TOTALPROTEIN 7.9 03/15/2020 02:09 PM    ALKPHOS 58 03/15/2020 02:09 PM    Lab Results   Component Value Date/Time    AST 39 03/15/2020 02:09 PM    ALT 33 (H) 03/15/2020 02:09 PM        BP (Non-Invasive): 99/61      Disposition:  I saw the patient on the day of discharge and this note also serves as a progress note for the day of discharge.  I discussed the case with the nursing and social work and review of the hospital chart.  I personally examined the patient and > 30 minutes was spent with the patient in interview and discharge planning. The patient was provided with prescriptions for the psychotropic medications from this hospitalization. At the time of discharge patient was medically stable.  There are no pending labs or imaging studies.  There are no restrictions regarding activities.    DISCHARGE MEDICATIONS:       Current Discharge Medication List      START taking these medications.      Details    melatonin 3 mg Tablet   6 mg, Oral, NIGHTLY, MAY REPEAT X 1  Qty: 120 Tablet  Refills: 0     risperiDONE 1 mg Tablet  Commonly known as: RISPERDAL   1 mg, Oral, 2 TIMES DAILY  Qty: 60 Tablet  Refills: 0     sertraline 25 mg Tablet  Commonly known as:  ZOLOFT   25 mg, Oral, NIGHTLY  Qty: 30 Tablet  Refills: 0              DISCHARGE INSTRUCTIONS:     - Take all medications as prescribed and listed above.     - Discussed benefits of healthy life style including healthy diet and exercises.    - The patient was given the emergency contact numbers, requested to call the suicidal hotline/911/ emergency contact/ hospital if patient gets suicidal/hoimicdal ideations/ feels unsafe/ side effects/ any other problems arise. Access to emergency psychiatric care was reviewed with patient should they need it.    - Patient was offered psychoeducation about illness and the importance of taking all medications as prescribed and attending all follow-up appointments as arranged which the patient agreed to do.      - The patient and I discussed the serious negative and dangerous impact that alcohol and substances can have on their mental health, physical health and efficacy of their psychotropic medications. I have recommended that the patient completely abstain from alcohol and substances. The patient verbalized understanding of the information and recommendations given.    CONDITION ON DISCHARGE:  Ambulation: Full  Self-care Ability: Complete  Cognitive Status AOx3   DNR status at discharge: full code    DISCHARGE INSTRUCTIONS:  CRISIS TEXT LINE  Confidential, Free, 24 hours a day, 7 days a week  Text "START" to [phone],someone will text you back    OUTPATIENT FOLLOW UP:  [facility] [place]  On 04/06/2020  Appointment to get established with this provider at 2:00 p.m.   [facility] [place]  Operated by [facility]  [address]  [place], [place] [address]  Monday - Friday: 8 am - 5 pm  [phone]       DISCHARGE DISPOSITION:  Home discharge     cc: Primary Care Physician:  No Pcp  No address on file      Maximino Sarin, MD 03/22/2020, 14:30

## 2020-03-22 NOTE — Group Note (Signed)
Group topic:  EXPERIENTIAL THERAPY/ACTIVITY GROUP    Date of group:  03/22/2020  Start time of group:  1015  End time of group:  1045               Summary of group discussion:  Conducted experiential therapy group using DBT and person centered strategies to practice mindfulness and foster relaxation.  Patients engaged in [place] Meditation exercise while listening to ambient zen music.  Patient completed a Mindfulness Check In self-assessment, engaged in the experiential exercise, and then completed a Mindfulness Check Out self-assessment.  *All patients in group were reminded to follow COVID 19 social distancing and masking guidelines.         Debra Castaneda  is a 30 y.o. female participating in a experiential/activity group.    Patient's mental status/affect: Calm, Appropriate    Patient's behavior: Pt was appropriate in group.    Patient's response: Pt was in and out of group but tried participating in the activity during times she was in the room.     Fredderick Phenix, SOCIAL WORKER  03/22/2020, 10:54

## 2020-03-22 NOTE — Group Note (Signed)
Group topic:  MUSIC THERAPY GROUP    Date of group:  03/22/2020  Start time of group:  1100  End time of group:  1200      Group Summary: Patients participated in group music therapy session. Patients engaged in movement to music, and guided music and mindfulness meditation. Patients were given opportunity post-intervention to process experience and share mindfulness and relaxation strategies.       Debra Castaneda  is a 30 y.o. female participating in a music group.    Affect/Mood:  Appropriate    Thought Process:  Logical    Thought Content:  Within normal limits    Interpersonal:  Attentive and Engaged    Level of participation:  Full    Comments: MT notes patient appeared more present in group today. Pt's verbal responses and engagement increased from last week's sessions.     Tonye Becket, MMT, MT-BC  03/22/2020, 12:55

## 2020-03-22 NOTE — Nurses Notes (Signed)
Patient is alert, oriented x 4, calm and cooperative.  Patient denies any hallucinations, suicidal or homicidal thoughts.  Patient is walking halls, stating she is bored.  She went to evening group.  She is med compliant and request medication to help with anxiety.  No pain reported at this time.  Patient was encouraged to seek staff regarding any questions or concerns.  Staff will continue to round every 15 minutes for patient safety.

## 2020-04-06 ENCOUNTER — Ambulatory Visit (INDEPENDENT_AMBULATORY_CARE_PROVIDER_SITE_OTHER): Payer: Self-pay | Admitting: Student in an Organized Health Care Education/Training Program

## 2020-04-06 ENCOUNTER — Other Ambulatory Visit: Payer: Self-pay

## 2020-04-06 ENCOUNTER — Encounter (INDEPENDENT_AMBULATORY_CARE_PROVIDER_SITE_OTHER): Payer: Self-pay | Admitting: Student in an Organized Health Care Education/Training Program

## 2020-04-06 VITALS — BP 118/70 | HR 92 | Temp 97.7°F | Resp 18 | Ht 62.5 in | Wt 142.6 lb

## 2020-04-06 DIAGNOSIS — F39 Unspecified mood [affective] disorder: Secondary | ICD-10-CM

## 2020-04-06 DIAGNOSIS — F319 Bipolar disorder, unspecified: Secondary | ICD-10-CM

## 2020-04-06 MED ORDER — SERTRALINE 50 MG TABLET
50.0000 mg | ORAL_TABLET | Freq: Every day | ORAL | 3 refills | Status: DC
Start: 2020-04-06 — End: 2021-02-10

## 2020-04-06 NOTE — Nursing Note (Signed)
Chief Complaint:   Chief Complaint            Hospital Follow Up         Functional Health Screen        Vital Signs  BP 118/70    Pulse 92    Temp 36.5 C (97.7 F) (Temporal)    Resp 18    Ht 1.588 m (5' 2.5")    Wt 64.7 kg (142 lb 9.6 oz)    LMP  (Approximate)    Breastfeeding No    BMI 25.67 kg/m       Social History     Tobacco Use   Smoking Status Former Smoker   Smokeless Tobacco Never Used   Tobacco Comment    stopped years ago     Allergies  Allergies   Allergen Reactions    Lactose  Other Adverse Reaction (Add comment)     Upset stomach; diarrhea    Penicillins     Pork/Porcine Containing Products      Upset stomach     Medication History  Reviewed for OTC medication and any new medications, provider will review medication history  Care Team  Patient Care Team:  Richardson Dopp, DO as PCP - General (FAMILY MEDICINE)  Immunizations - last 24 hours     None        Anise Salvo, [place]  04/06/2020, 13:58

## 2020-04-06 NOTE — Patient Instructions (Signed)
CRISIS TEXT LINE  Confidential, Free, 24 hours a day, 7 days a week  Text "START" to [phone],someone will text you back

## 2020-04-06 NOTE — Progress Notes (Signed)
FAMILY MEDICINE, [facility] MEDICAL OFFICE BUILDING  [address]  [place] [address]       Name: Debra Castaneda MRN:  [id number]   Date: 04/06/2020 Age: 30 y.o.     CC:   Chief Complaint   Patient presents with    Hospital Follow Up         HPI: Debra Castaneda is a 30 y.o. female who presents today as a new patient after recent hospitalization at [facility] Psych unit, voluntarily. Discharge summary reviewed from 2/7. Patient admitted for MDD and PTSD with SI and psychosis. Per documentation, she has history of bipolar I disorder as well. Labs reviewed and unremarkable. Patient is now on risperidone 1 mg BID and sertraline 20 mg QPM. Patient reports long standing history of mood problems resulting in hospitalizations, however last was several years ago. She reports being on several medications in the past but stopped them all because they did not make her feel good; she states she felt like a ghost. There is significant family history of psychiatric illness. Patient herself lives with paternal uncle growing up and family was in [place]. She now lives with distant relatives after her uncle passed but she is moving currently. She works as Public house manager.    She reports having side effect of breast lactation and had 1 episode of dizziness last week. Otherwise, not significant side effects to the medication. The melatonin is helping her sleep, but sometimes she will still wake up early morning hours. She feels like her mood right now is okay. She reports these instances occur with no known trigger and she will come out the other side of them and not even really remember the events that took place.    Patient not currently in a relationship or sexually active.    Of note, patient COVID positive on 03/15/2020.    Review of Systems   Constitutional: Negative for chills, fever and malaise/fatigue.   Respiratory: Negative for cough, shortness of breath and wheezing.    Cardiovascular: Negative for chest pain,  palpitations and leg swelling.   Gastrointestinal: Negative for abdominal pain, nausea and vomiting.   Neurological: Positive for dizziness. Negative for headaches.   Psychiatric/Behavioral: Negative for depression, substance abuse and suicidal ideas. The patient is not nervous/anxious.        PMH:  Patient Active Problem List   Diagnosis    Unspecified mood (affective) disorder (CMS HCC)       PSH:  No past surgical history on file.    Medication:  Current Outpatient Medications   Medication Sig    melatonin 3 mg Oral Tablet Take 2 Tablets (6 mg total) by mouth Every night, may repeat one time for 30 days    risperiDONE (RISPERDAL) 1 mg Oral Tablet Take 1 Tablet (1 mg total) by mouth Twice daily for 30 days    sertraline (ZOLOFT) 50 mg Oral Tablet Take 1 Tablet (50 mg total) by mouth Once a day for 90 days       Allergies:  Allergies   Allergen Reactions    Lactose  Other Adverse Reaction (Add comment)     Upset stomach; diarrhea    Penicillins     Pork/Porcine Containing Products      Upset stomach       Immunizations:    There is no immunization history on file for this patient.    Family History:  family history includes Bipolar Disorder in her father and paternal uncle; Depression in her  paternal grandmother; Liver Cancer in her paternal uncle; Schizophrenia in her paternal uncle; Stomach Cancer in her paternal uncle; Suicide in her paternal aunt.    Social History:  Social History     Socioeconomic History    Marital status: Single   Tobacco Use    Smoking status: Former Smoker    Smokeless tobacco: Never Used    Tobacco comment: smokes Hookah   Vaping Use    Vaping Use: Never used   Substance and Sexual Activity    Alcohol use: Yes     Alcohol/week: 3.0 standard drinks     Types: 3 Glasses of wine per week     Comment: 2-3/week    Drug use: Yes     Types: Marijuana     Comment: every 6 months     OB History   Obstetric Comments   Never had PAP, not sexually active         Objective:  BP 118/70     Pulse 92    Temp 36.5 C (97.7 F) (Temporal)    Resp 18    Ht 1.588 m (5' 2.5")    Wt 64.7 kg (142 lb 9.6 oz)    LMP  (Approximate)    Breastfeeding No    BMI 25.67 kg/m       Physical Exam  Constitutional:       General: She is not in acute distress.  HENT:      Head: Normocephalic and atraumatic.   Eyes:      Conjunctiva/sclera: Conjunctivae normal.   Pulmonary:      Effort: Pulmonary effort is normal. No respiratory distress.   Neurological:      Mental Status: She is alert and oriented to person, place, and time.      GCS: GCS score is 15.   Psychiatric:         Mood and Affect: Mood normal. Affect is blunt.         Speech: Speech normal.         Thought Content: Thought content normal.      Comments: Well-groomed appearance.    Answers questions appropriately.    Does not make eye contact at all times while speaking.  Fidgets.        Got 1st COVID vaccine yesterday    Labs:  I personally reviewed discharge summary and labs from 03/15/2020 and 03/17/2020.      A/P:    ICD-10-CM    1. Unspecified mood (affective) disorder (CMS HCC)  F39 sertraline (ZOLOFT) 50 mg Oral Tablet     Refer to [facility]   2. Bipolar disorder with depression (CMS HCC)  F31.9      Discussed stopping risperidone due to side effect.  Will increase sertraline to 50 mg nightly.  ASAP referral to Psychiatry, whom cared for patient during admission  Given crisis text line on AVS    Declines flu vaccine  RTC for PAP    RTC for PAP    Richardson Dopp, DO  04/06/2020, 14:06

## 2020-04-17 ENCOUNTER — Encounter (INDEPENDENT_AMBULATORY_CARE_PROVIDER_SITE_OTHER): Payer: Self-pay

## 2020-09-10 ENCOUNTER — Other Ambulatory Visit: Payer: Self-pay

## 2020-09-10 ENCOUNTER — Ambulatory Visit (INDEPENDENT_AMBULATORY_CARE_PROVIDER_SITE_OTHER): Payer: Self-pay | Admitting: Student in an Organized Health Care Education/Training Program

## 2020-09-10 ENCOUNTER — Encounter (INDEPENDENT_AMBULATORY_CARE_PROVIDER_SITE_OTHER): Payer: Self-pay | Admitting: Student in an Organized Health Care Education/Training Program

## 2020-09-10 VITALS — BP 120/70 | HR 62 | Temp 97.3°F | Resp 17 | Wt 128.0 lb

## 2020-09-10 DIAGNOSIS — F39 Unspecified mood [affective] disorder: Secondary | ICD-10-CM

## 2020-09-10 DIAGNOSIS — R410 Disorientation, unspecified: Secondary | ICD-10-CM

## 2020-09-10 DIAGNOSIS — Z87891 Personal history of nicotine dependence: Secondary | ICD-10-CM

## 2020-09-10 NOTE — Nursing Note (Signed)
Chief Complaint:   Chief Complaint            Confusion         Functional Health Screen        Vital Signs  BP 120/70   Pulse 62   Temp 36.3 C (97.3 F) (Temporal)   Resp 17   Wt 58.1 kg (128 lb)   BMI 23.04 kg/m       Social History     Tobacco Use   Smoking Status Former Smoker   Smokeless Tobacco Never Used   Tobacco Comment    smokes Hookah     Allergies  Allergies   Allergen Reactions   . Lactose  Other Adverse Reaction (Add comment)     Upset stomach; diarrhea   . Penicillins    . Pork/Porcine Containing Products      Upset stomach     Medication History  Reviewed for OTC medication and any new medications, provider will review medication history  Care Team  Patient Care Team:  Richardson Dopp, DO as PCP - General (FAMILY MEDICINE)  Immunizations - last 24 hours     None        [place], [place]  09/10/2020, 13:23

## 2020-09-10 NOTE — Progress Notes (Signed)
FAMILY MEDICINE, [facility] MEDICAL OFFICE BUILDING  [address]  [place] [address]    Acute Visit     Name: Debra Castaneda MRN:  [id number]   Date: 09/10/2020 Age: 30 y.o.         Chief Complaint(s):   Chief Complaint   Patient presents with   . Confusion       SUBJECTIVE:  Debra Castaneda is a 30 y.o. female presenting today with her friend for 3 days of confusion and seeming "spacy" as well as insomnia and lack of appetite. Patient has h/o bipolar disorder and has been hospitalized in psychiatric unit on several occasions due to similar symptoms. Patient was last here as a new patient to establish in February and was referred ASAP to psychiatry but has not established due to her concern for lack of insurance. She is currently on Zoloft 50 mg daily only. She was previously doing well with this. Her friend with her today has been caring for her and trying to make her eat. She has been giving her melatonin and benadryl to make her sleep; the patient will sleep 1-2 hours then be awake for 6 before falling asleep for short period of time again.      Review of Systems   Constitutional: Negative for chills and fever.   Psychiatric/Behavioral: Positive for depression. Negative for substance abuse. The patient has insomnia. The patient is not nervous/anxious.        Past Medical History:  Past Medical History:   Diagnosis Date   . Anxiety    . Asthma    . Depression            Medications:  Current Outpatient Medications   Medication Sig   . sertraline (ZOLOFT) 50 mg Oral Tablet Take 1 Tablet (50 mg total) by mouth Once a day for 90 days        Allergies:  Allergies   Allergen Reactions   . Lactose  Other Adverse Reaction (Add comment)     Upset stomach; diarrhea   . Penicillins    . Pork/Porcine Containing Products      Upset stomach        Social History:  Social History     Tobacco Use   . Smoking status: Former Games developer   . Smokeless tobacco: Never Used   . Tobacco comment: smokes Hookah   Vaping Use    . Vaping Use: Never used   Substance Use Topics   . Alcohol use: Yes     Alcohol/week: 3.0 standard drinks     Types: 3 Glasses of wine per week     Comment: 2-3/week   . Drug use: Yes     Types: Marijuana     Comment: every 6 months        Family History:  Family Medical History:     Problem Relation (Age of Onset)    Bipolar Disorder Father, Paternal Uncle    Depression Paternal Grandmother    Liver Cancer Paternal Uncle    Schizophrenia Paternal Uncle    Stomach Cancer Paternal Uncle    Suicide Paternal Aunt            OBJECTIVE:  BP 120/70   Pulse 62   Temp 36.3 C (97.3 F) (Temporal)   Resp 17   Wt 58.1 kg (128 lb)   BMI 23.04 kg/m         Physical Exam  Constitutional:       General: She is  not in acute distress.  HENT:      Head: Normocephalic and atraumatic.   Eyes:      Conjunctiva/sclera: Conjunctivae normal.   Pulmonary:      Effort: Pulmonary effort is normal. No respiratory distress.   Neurological:      Mental Status: She is alert and oriented to person, place, and time.   Psychiatric:         Mood and Affect: Affect is blunt.         Speech: Speech is delayed.         Behavior: Behavior is slowed.      Comments: Does not make eye contact at all times.  Delayed responses.  Responses trail off in confusion.           ASSESSMENT and PLAN:    ICD-10-CM    1. Unspecified mood (affective) disorder (CMS HCC)  F39    2. Delirium  R41.0        Advised patient to go to ED for further evaluation to include toxicology, blood gas, and possible imaging. Most likely patient requires another psychiatric consult and can hopefully get established with outpatient follow up. Patient reports to me she will not go to the ED; her friend with her today states she "will make her" go to ED. Unfortunately I do not have any information for involuntary admission and am not sure this is necessarily appropriate. I greatly encouraged patient to go to ED and attempted to find out why she was opposed but patient shut down and  no longer wanted to discuss.    Richardson Dopp, DO  09/10/2020, 13:26

## 2020-09-10 NOTE — Telephone Encounter (Signed)
Patient's friend, Jearld Shines, called [facility] stating that patient is having symptoms of confusion, insomnia and seeming spacey for 3 days. States that patient could not go to work yesterday due to her confusion. This nurse scheduled patient for an acute visit this date.Estelle June, RN  09/10/2020, 11:44

## 2021-02-10 ENCOUNTER — Other Ambulatory Visit (INDEPENDENT_AMBULATORY_CARE_PROVIDER_SITE_OTHER): Payer: Self-pay | Admitting: Student in an Organized Health Care Education/Training Program

## 2021-02-10 DIAGNOSIS — F39 Unspecified mood [affective] disorder: Secondary | ICD-10-CM

## 2021-02-11 MED ORDER — SERTRALINE 50 MG TABLET
50.0000 mg | ORAL_TABLET | Freq: Every day | ORAL | 3 refills | Status: DC
Start: 2021-02-11 — End: 2021-07-09

## 2021-07-09 ENCOUNTER — Other Ambulatory Visit (INDEPENDENT_AMBULATORY_CARE_PROVIDER_SITE_OTHER): Payer: Self-pay | Admitting: GENERAL

## 2021-07-09 DIAGNOSIS — F39 Unspecified mood [affective] disorder: Secondary | ICD-10-CM

## 2021-07-12 MED ORDER — SERTRALINE 50 MG TABLET
50.0000 mg | ORAL_TABLET | Freq: Every day | ORAL | 3 refills | Status: DC
Start: 2021-07-12 — End: 2022-05-05

## 2021-12-19 ENCOUNTER — Encounter (INDEPENDENT_AMBULATORY_CARE_PROVIDER_SITE_OTHER): Payer: Self-pay | Admitting: Student in an Organized Health Care Education/Training Program

## 2022-05-05 ENCOUNTER — Other Ambulatory Visit (INDEPENDENT_AMBULATORY_CARE_PROVIDER_SITE_OTHER): Payer: Self-pay | Admitting: GENERAL

## 2022-05-05 ENCOUNTER — Encounter (INDEPENDENT_AMBULATORY_CARE_PROVIDER_SITE_OTHER): Payer: Self-pay

## 2022-05-05 DIAGNOSIS — F39 Unspecified mood [affective] disorder: Secondary | ICD-10-CM

## 2022-05-05 NOTE — Telephone Encounter (Signed)
LOV- 09/10/20 thompson  NOV- none  sent message to schedule appt.  Refill - Sertraline  Chartered loss adjuster

## 2022-08-05 ENCOUNTER — Other Ambulatory Visit (INDEPENDENT_AMBULATORY_CARE_PROVIDER_SITE_OTHER): Payer: Self-pay | Admitting: GENERAL

## 2022-08-05 DIAGNOSIS — F39 Unspecified mood [affective] disorder: Secondary | ICD-10-CM

## 2022-11-16 ENCOUNTER — Other Ambulatory Visit (INDEPENDENT_AMBULATORY_CARE_PROVIDER_SITE_OTHER): Payer: Self-pay | Admitting: Student in an Organized Health Care Education/Training Program

## 2022-11-16 DIAGNOSIS — F39 Unspecified mood [affective] disorder: Secondary | ICD-10-CM

## 2023-03-09 ENCOUNTER — Other Ambulatory Visit (INDEPENDENT_AMBULATORY_CARE_PROVIDER_SITE_OTHER): Payer: Self-pay | Admitting: Student in an Organized Health Care Education/Training Program

## 2023-03-09 DIAGNOSIS — F39 Unspecified mood [affective] disorder: Secondary | ICD-10-CM

## 2023-03-21 ENCOUNTER — Other Ambulatory Visit (INDEPENDENT_AMBULATORY_CARE_PROVIDER_SITE_OTHER): Payer: Self-pay | Admitting: Student in an Organized Health Care Education/Training Program

## 2023-03-21 DIAGNOSIS — F39 Unspecified mood [affective] disorder: Secondary | ICD-10-CM

## 2023-03-21 MED ORDER — SERTRALINE 50 MG TABLET
50.0000 mg | ORAL_TABLET | Freq: Every day | ORAL | 0 refills | Status: DC
Start: 2023-03-21 — End: 2023-06-20

## 2023-06-18 ENCOUNTER — Other Ambulatory Visit (INDEPENDENT_AMBULATORY_CARE_PROVIDER_SITE_OTHER): Payer: Self-pay | Admitting: Family Medicine

## 2023-06-18 DIAGNOSIS — F39 Unspecified mood [affective] disorder: Secondary | ICD-10-CM

## 2024-03-21 ENCOUNTER — Ambulatory Visit (INDEPENDENT_AMBULATORY_CARE_PROVIDER_SITE_OTHER): Payer: Self-pay
